# Patient Record
Sex: Male | Born: 1960
Health system: Southern US, Community
[De-identification: ages and names within clinical notes are randomized; demographics above are authoritative.]

## PROBLEM LIST (undated history)

## (undated) DIAGNOSIS — T7840XA Allergy, unspecified, initial encounter: Secondary | ICD-10-CM

## (undated) DIAGNOSIS — J439 Emphysema, unspecified: Secondary | ICD-10-CM

## (undated) DIAGNOSIS — K219 Gastro-esophageal reflux disease without esophagitis: Secondary | ICD-10-CM

## (undated) DIAGNOSIS — G250 Essential tremor: Secondary | ICD-10-CM

## (undated) HISTORY — PX: NO PAST SURGERIES: SHX2092

## (undated) HISTORY — DX: Gastro-esophageal reflux disease without esophagitis: K21.9

## (undated) HISTORY — DX: Emphysema, unspecified: J43.9

## (undated) HISTORY — DX: Essential tremor: G25.0

## (undated) HISTORY — DX: Allergy, unspecified, initial encounter: T78.40XA

---

## 2012-06-09 ENCOUNTER — Encounter: Payer: Self-pay | Admitting: Nurse Practitioner

## 2012-06-09 ENCOUNTER — Ambulatory Visit (INDEPENDENT_AMBULATORY_CARE_PROVIDER_SITE_OTHER): Payer: BC Managed Care – PPO | Admitting: Nurse Practitioner

## 2012-06-09 VITALS — BP 129/86 | HR 67 | Ht 73.0 in | Wt 224.0 lb

## 2012-06-09 DIAGNOSIS — G250 Essential tremor: Secondary | ICD-10-CM

## 2012-06-09 DIAGNOSIS — G252 Other specified forms of tremor: Secondary | ICD-10-CM

## 2012-06-09 MED ORDER — PROPRANOLOL HCL 10 MG PO TABS: 10.0000 mg | ORAL_TABLET | Freq: Two times a day (BID) | ORAL | Status: DC

## 2012-06-09 NOTE — Patient Instructions (Addendum)
Hand writing sample stable Continue propanolol 10 mg twice daily Prescription will be renewed Followup in 6-8 months

## 2012-06-09 NOTE — Progress Notes (Signed)
I have read the note, and I agree with the clinical assessment and plan.  

## 2012-06-09 NOTE — Progress Notes (Signed)
HPI: Patient returns for followup after her last visit 12/10/2011. History of benign essential tremor. The tremor affects only the arms without involvement of the head neck or voice. The patient has used Xanax in the past but he rarely uses this medication now.  When last seen his propanolol was increased to twice a day and his tremor is stable. No interval medical issues.  ROS:  - tremor  Physical Exam General: well developed, well nourished, seated, in no evident distress Head: head normocephalic and atraumatic. Oropharynx benign Neck: supple with no carotid or supraclavicular bruits Cardiovascular: regular rate and rhythm, no murmurs  Neurologic Exam Mental Status: Awake and fully alert.   Cranial Nerves:  Pupils equal, briskly reactive to light. Extraocular movements full without nystagmus. Visual fields full to confrontation. Hearing intact and symmetric to finger snap. Facial sensation intact. Face, tongue, palate move normally and symmetrically. Neck flexion and extension normal.  Motor: Normal bulk and tone. Normal strength in all tested extremity muscles. Minimal outstretched tremor. Sensory.: intact to touch and pinprick and vibratory.  Coordination: Rapid alternating movements normal in all extremities. Finger-to-nose and heel-to-shin performed accurately bilaterally. Gait and Station: Arises from chair without difficulty. Stance is normal. Gait demonstrates normal stride length and balance . Able to heel, toe and tandem walk without difficulty.  Reflexes: 2+ and symmetric. Toes downgoing.     ASSESSMENT: Benign essential tremor, writing sample obtained     PLAN: Continue propanolol 10 mg twice daily Followup in 6-8 months  [name] Riggs, GNP-BC APRN

## 2012-12-09 ENCOUNTER — Ambulatory Visit (INDEPENDENT_AMBULATORY_CARE_PROVIDER_SITE_OTHER): Payer: BC Managed Care – PPO | Admitting: Nurse Practitioner

## 2012-12-09 ENCOUNTER — Encounter: Payer: Self-pay | Admitting: Nurse Practitioner

## 2012-12-09 VITALS — BP 136/67 | HR 95 | Ht 73.0 in | Wt 227.0 lb

## 2012-12-09 DIAGNOSIS — G250 Essential tremor: Secondary | ICD-10-CM

## 2012-12-09 MED ORDER — PROPRANOLOL HCL 10 MG PO TABS: 10.0000 mg | ORAL_TABLET | Freq: Two times a day (BID) | ORAL | Status: DC

## 2012-12-09 NOTE — Progress Notes (Signed)
[facility]  PATIENT: Roger Copeland DOB: [birth year]   REASON FOR VISIT: Followup for benign essential tremor  HISTORY OF PRESENT ILLNESS:Roger Copeland, 52 year old male with history of benign essential tremor returns for followup. He was last seen in this office on 06/09/2012. The tremor affects only the arms without involvement of the head neck or voice. The patient has used Xanax in the past but he rarely uses this medication now. He is currently on  propanolol 10mg   twice a day and his tremor is stable. No interval medical issues. No difficulty performing his ADLs. No difficulty with his job performance.    REVIEW OF SYSTEMS: Full 14 system review of systems performed and notable only for:  Constitutional: N/A  Cardiovascular: N/A  Ear/Nose/Throat: N/A  Skin: N/A  Eyes: N/A  Respiratory: N/A  Gastroitestinal: N/A  Hematology/Lymphatic: N/A  Endocrine: N/A Musculoskeletal:N/A  Allergy/Immunology: N/A  Neurological: tremor Psychiatric: N/A   ALLERGIES: No Known Allergies  HOME MEDICATIONS: Outpatient Prescriptions Prior to Visit  Medication Sig Dispense Refill  . omeprazole (PRILOSEC) 20 MG capsule Take 20 mg by mouth daily.      . propranolol (INDERAL) 10 MG tablet Take 1 tablet (10 mg total) by mouth 2 (two) times daily.  60 tablet  8   No facility-administered medications prior to visit.    PAST MEDICAL HISTORY: Past Medical History  Diagnosis Date  . Essential tremor   . Gastroesophageal reflux disease     PAST SURGICAL HISTORY: History reviewed. No pertinent past surgical history.  FAMILY HISTORY: Family History  Problem Relation Age of Onset  . Heart failure Mother   . Lung cancer Father     SOCIAL HISTORY: History   Social History  . Marital Status: Married    Spouse Name: N/A    Number of Children: 1  . Years of Education: associates   Occupational History  . operations     [employer]   Social History Main Topics  .  Smoking status: Current Every Day Smoker -- 1.50 packs/day    Types: Cigarettes  . Smokeless tobacco: Never Used  . Alcohol Use: Yes     Comment: drinks on occasions  . Drug Use: No  . Sexual Activity: Not on file   Other Topics Concern  . Not on file   Social History Narrative   The patient is married and lives with his wife.      PHYSICAL EXAM  Filed Vitals:   12/09/12 0835  BP: 136/67  Pulse: 95  Height: 6\' 1"  (1.854 m)  Weight: 227 lb (102.967 kg)   Body mass index is 29.96 kg/(m^2).  Generalized: Well developed, in no acute distress   Neurological examination   Mentation: Alert oriented to time, place, history taking. Follows all commands speech and language fluent  Cranial nerve II-XII: Pupils were equal round reactive to light extraocular movements were full, visual field were full on confrontational test. Facial sensation and strength were normal. hearing was intact to finger rubbing bilaterally. Uvula tongue midline. head turning and shoulder shrug and were normal and symmetric.Tongue protrusion into cheek strength was normal. Motor: normal bulk and tone, full strength in the BUE, BLE, fine finger movements normal, no pronator drift. No focal weakness. Minimal outstretch tremor of the right index finger only. Coordination: finger-nose-finger, heel-to-shin bilaterally, no dysmetria Reflexes: Brachioradialis 2/2, biceps 2/2, triceps 2/2, patellar 2/2, Achilles 2/2, plantar responses were flexor bilaterally. Gait and Station: Rising up from seated position without assistance,  normal stance,  moderate stride, good arm swing, smooth turning, able to perform tiptoe, and heel walking without difficulty.   DIAGNOSTIC DATA (LABS, IMAGING, TESTING) - I reviewed patient records, labs, notes, testing and imaging myself where available.       ASSESSMENT AND PLAN  52 y.o. year old male  has a past medical history of Essential tremor here for followup. Tremor is in good  control Pt to continue propanolol 10 mg twice daily Renew for the next year Followup yearly and when necessary [name] Riggs, [facility], [facility], APRN  [facility] [address] [place], [place] [address] [phone]

## 2012-12-09 NOTE — Patient Instructions (Signed)
Pt to continue propanolol 10 mg twice daily Renew for the next year Followup yearly and when necessary

## 2012-12-09 NOTE — Progress Notes (Signed)
I have read the note, and I agree with the clinical assessment and plan.  Ramadan Couey KEITH   

## 2013-12-09 ENCOUNTER — Encounter: Payer: Self-pay | Admitting: Nurse Practitioner

## 2013-12-09 ENCOUNTER — Ambulatory Visit (INDEPENDENT_AMBULATORY_CARE_PROVIDER_SITE_OTHER): Payer: BC Managed Care – PPO | Admitting: Nurse Practitioner

## 2013-12-09 VITALS — BP 124/88 | HR 63 | Ht 73.0 in | Wt 219.0 lb

## 2013-12-09 DIAGNOSIS — G250 Essential tremor: Secondary | ICD-10-CM

## 2013-12-09 DIAGNOSIS — R251 Tremor, unspecified: Secondary | ICD-10-CM

## 2013-12-09 DIAGNOSIS — G252 Other specified forms of tremor: Principal | ICD-10-CM

## 2013-12-09 MED ORDER — PROPRANOLOL HCL 10 MG PO TABS: 20.0000 mg | ORAL_TABLET | Freq: Two times a day (BID) | ORAL | Status: DC

## 2013-12-09 NOTE — Progress Notes (Signed)
[facility]  PATIENT: Roger NettleJames Pitz DOB: 12/17/1960   REASON FOR VISIT:follow up for essential tremor   HISTORY OF PRESENT ILLNESS:Mr. Colarusso, 53 year old male with history of benign essential tremor returns for followup. He was last seen in this office on 12/09/12. The essential tremor affects only the arms without involvement of the head neck or voice. The patient has used Xanax in the past but he rarely uses this medication now. He is currently on propanolol 10mg  twice a day and his tremor is a little worse. 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 No interval medical issues. No difficulty performing his ADLs.  He returns for reevaluation  REVIEW OF SYSTEMS: Full 14 system review of systems performed and notable only for those listed, all others are neg:  Constitutional: N/A  Cardiovascular: N/A  Ear/Nose/Throat: N/A  Skin: N/A  Eyes: N/A  Respiratory: N/A  Gastroitestinal: N/A  Hematology/Lymphatic: N/A  Endocrine: N/A Musculoskeletal:N/A  Allergy/Immunology: N/A  Neurological: Essential tremor  Psychiatric: N/A Sleep : NA   ALLERGIES: No Known Allergies  HOME MEDICATIONS: Outpatient Prescriptions Prior to Visit  Medication Sig Dispense Refill  . CRESTOR 10 MG tablet       . omeprazole (PRILOSEC) 20 MG capsule Take 20 mg by mouth daily.      . propranolol (INDERAL) 10 MG tablet Take 1 tablet (10 mg total) by mouth 2 (two) times daily.  60 tablet  11   No facility-administered medications prior to visit.    PAST MEDICAL HISTORY: Past Medical History  Diagnosis Date  . Essential tremor   . Gastroesophageal reflux disease     PAST SURGICAL HISTORY: History reviewed. No pertinent past surgical history.  FAMILY HISTORY: Family History  Problem Relation Age of Onset  . Heart failure Mother   . Lung cancer Father     SOCIAL HISTORY: History   Social History  . Marital Status: Married    Spouse Name: Dedorah     Number of  Children: 1  . Years of Education: associates   Occupational History  . operations     [employer]  .     Social History Main Topics  . Smoking status: Current Every Day Smoker -- 1.50 packs/day    Types: Cigarettes  . Smokeless tobacco: Never Used  . Alcohol Use: Yes     Comment: drinks on occasions  . Drug Use: No  . Sexual Activity: Not on file   Other Topics Concern  . Not on file   Social History Narrative   The patient is married and lives with his wife.    Patient has 1 child.    Patient has a college education.    Patient is right handed.      PHYSICAL EXAM  Filed Vitals:   12/09/13 0833  BP: 124/88  Pulse: 63  Height: 6\' 1"  (1.854 m)  Weight: 219 lb (99.338 kg)   Body mass index is 28.9 kg/(m^2). Generalized: Well developed, in no acute distress  Neurological examination  Mentation: Alert oriented to time, place, history taking. Follows all commands speech and language fluent  Cranial nerve II-XII: Pupils were equal round reactive to light extraocular movements were full, visual field were full on confrontational test. Facial sensation and strength were normal. hearing was intact to finger rubbing bilaterally. Uvula tongue midline. head turning and shoulder shrug and were normal and symmetric.Tongue protrusion into cheek strength was normal.  Motor: normal bulk and tone, full strength  in the BUE, BLE, fine finger movements normal, no pronator drift. No focal weakness. Minimal outstretch tremor of both hands .  Coordination: finger-nose-finger, heel-to-shin bilaterally, no dysmetria  Reflexes: Brachioradialis 2/2, biceps 2/2, triceps 2/2, patellar 2/2, Achilles 2/2, plantar responses were flexor bilaterally.  Gait and Station: Rising up from seated position without assistance, normal stance, moderate stride, good arm swing, smooth turning, able to perform tiptoe, and heel walking without difficulty. Tandem gait is stable  DIAGNOSTIC DATA (LABS, IMAGING,  TESTING) -  ASSESSMENT AND PLAN  53 y.o. year old male  has a past medical history of Essential tremor here to followup. His tremor is confined to the hands and is slightly worse than when last seen. Occasionally interferes with his job performance.  Increase Inderal to 2 tabs twice daily Will refill Followup yearly and when necessary [name] RiggsNancy Carolyn Aydenn Gervin, [facility]NP, [facility], APRN  [facility] [address], [address] [place], [place] [address] [phone]

## 2013-12-09 NOTE — Progress Notes (Signed)
I have read the note, and I agree with the clinical assessment and plan.  Tristram Milian KEITH   

## 2013-12-09 NOTE — Patient Instructions (Signed)
Increase Inderal to 2 tabs twice daily Will refill Followup yearly and when necessary

## 2014-12-12 ENCOUNTER — Encounter: Payer: Self-pay | Admitting: Nurse Practitioner

## 2014-12-12 ENCOUNTER — Ambulatory Visit (INDEPENDENT_AMBULATORY_CARE_PROVIDER_SITE_OTHER): Payer: BLUE CROSS/BLUE SHIELD | Admitting: Nurse Practitioner

## 2014-12-12 VITALS — BP 138/98 | HR 70 | Ht 72.0 in | Wt 231.0 lb

## 2014-12-12 DIAGNOSIS — G250 Essential tremor: Secondary | ICD-10-CM

## 2014-12-12 MED ORDER — PROPRANOLOL HCL 10 MG PO TABS: 20.0000 mg | ORAL_TABLET | Freq: Two times a day (BID) | ORAL | Status: DC

## 2014-12-12 NOTE — Progress Notes (Signed)
I have read the note, and I agree with the clinical assessment and plan.  Roger Copeland,Roger Copeland   

## 2014-12-12 NOTE — Progress Notes (Signed)
[facility]  PATIENT: Patricia NettleJames Pastrana DOB: 02/18/1961   REASON FOR VISIT: Follow-up for essential tremor HISTORY FROM: Patient    HISTORY OF PRESENT ILLNESS:Mr. Schimpf, 54 year old male with history of benign essential tremor returns for followup. He was last seen in this office on 12/09/13. The essential tremor affects only the arms without involvement of the head, neck or voice. The patient has used Xanax in the past but he rarely uses this medication now. He is currently on propanolol 20mg  twice a day and his tremor is better. Dose was increased at last visit. 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 No interval medical issues. No difficulty performing his ADLs. He returns for reevaluation   REVIEW OF SYSTEMS: Full 14 system review of systems performed and notable only for those listed, all others are neg:  Constitutional: neg  Cardiovascular: neg Ear/Nose/Throat: neg  Skin: neg Eyes: neg Respiratory: neg Gastroitestinal: neg  Hematology/Lymphatic: neg  Endocrine: neg Musculoskeletal:neg Allergy/Immunology: neg Neurological: Essential tremor Psychiatric: neg Sleep : neg   ALLERGIES: No Known Allergies  HOME MEDICATIONS: Outpatient Prescriptions Prior to Visit  Medication Sig Dispense Refill  . CRESTOR 10 MG tablet     . omeprazole (PRILOSEC) 20 MG capsule Take 20 mg by mouth daily.    . propranolol (INDERAL) 10 MG tablet Take 2 tablets (20 mg total) by mouth 2 (two) times daily. 120 tablet 11   No facility-administered medications prior to visit.    PAST MEDICAL HISTORY: Past Medical History  Diagnosis Date  . Essential tremor   . Gastroesophageal reflux disease     PAST SURGICAL HISTORY: History reviewed. No pertinent past surgical history.  FAMILY HISTORY: Family History  Problem Relation Age of Onset  . Heart failure Mother   . Lung cancer Father     SOCIAL HISTORY: Social History   Social History  . Marital  Status: Married    Spouse Name: Clayborn BignessDedorah   . Number of Children: 1  . Years of Education: associates   Occupational History  . operations     [employer]  .     Social History Main Topics  . Smoking status: Current Every Day Smoker -- 1.50 packs/day    Types: Cigarettes  . Smokeless tobacco: Never Used  . Alcohol Use: Yes     Comment: drinks on occasions  . Drug Use: No  . Sexual Activity: Not on file   Other Topics Concern  . Not on file   Social History Narrative   The patient is married and lives with his wife.    Patient has 1 child.    Patient has a college education.    Patient is right handed.      PHYSICAL EXAM  Filed Vitals:   12/12/14 0841  BP: 138/98  Pulse: 70  Height: 6' (1.829 m)  Weight: 231 lb (104.781 kg)   Body mass index is 31.32 kg/(m^2). Generalized: Well developed, in no acute distress  Neurological examination  Mentation: Alert oriented to time, place, history taking. Follows all commands speech and language fluent  Cranial nerve II-XII: Pupils were equal round reactive to light extraocular movements were full, visual field were full on confrontational test. Facial sensation and strength were normal. hearing was intact to finger rubbing bilaterally. Uvula tongue midline. head turning and shoulder shrug and were normal and symmetric.Tongue protrusion into cheek strength was normal.  Motor: normal bulk and tone, full strength in the BUE, BLE, fine  finger movements normal, no pronator drift. No focal weakness. Minimal outstretch tremor of both hands .  Coordination: finger-nose-finger, heel-to-shin bilaterally, no dysmetria  Reflexes: Brachioradialis 2/2, biceps 2/2, triceps 2/2, patellar 2/2, Achilles 2/2, plantar responses were flexor bilaterally.  Gait and Station: Rising up from seated position without assistance, normal stance, moderate stride, good arm swing, smooth turning, able to perform tiptoe, and heel walking without difficulty.  Tandem gait is stable  DIAGNOSTIC DATA (LABS, IMAGING, TESTING) - ASSESSMENT AND PLAN  54 y.o. year old male  has a past medical history of Essential tremor here to follow-up.  Continue Inderal 20 mg twice daily for essential tremor will refill 3 months with  3 refills Follow-up yearly and when necessary [name] Riggs, [facility], [facility], APRN  [facility] [address] [place], [place] [phone]

## 2014-12-12 NOTE — Patient Instructions (Addendum)
Continue Inderal 20 mg twice daily for essential tremor will refill 3 months with  3 refills Follow-up yearly and when necessary

## 2015-01-20 ENCOUNTER — Other Ambulatory Visit: Payer: Self-pay | Admitting: Nurse Practitioner

## 2015-12-12 ENCOUNTER — Ambulatory Visit (INDEPENDENT_AMBULATORY_CARE_PROVIDER_SITE_OTHER): Payer: BLUE CROSS/BLUE SHIELD | Admitting: Nurse Practitioner

## 2015-12-12 ENCOUNTER — Encounter: Payer: Self-pay | Admitting: Nurse Practitioner

## 2015-12-12 VITALS — BP 126/91 | HR 65 | Ht 72.0 in | Wt 226.0 lb

## 2015-12-12 DIAGNOSIS — G250 Essential tremor: Secondary | ICD-10-CM

## 2015-12-12 MED ORDER — PROPRANOLOL HCL 10 MG PO TABS: 20.0000 mg | ORAL_TABLET | Freq: Two times a day (BID) | ORAL | 3 refills | Status: DC

## 2015-12-12 NOTE — Progress Notes (Signed)
[facility]  PATIENT: Roger Copeland DOB: 09-23-1960   REASON FOR VISIT: Follow-up for essential tremor HISTORY FROM: Patient    HISTORY OF PRESENT ILLNESS:Mr. Taylor, 55 year old male with history of benign essential tremor returns for yearly followup. The essential tremor affects only the arms without involvement of the head, neck or voice. The patient has used Xanax in the past but he rarely uses this medication now. He is currently on propanolol 20mg  twice a day and his tremor is better. He claims he can really tell the difference if he misses a dose . 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 No interval medical issues. No difficulty performing his ADLs. He returns for reevaluation   REVIEW OF SYSTEMS: Full 14 system review of systems performed and notable only for those listed, all others are neg:  Constitutional: neg  Cardiovascular: neg Ear/Nose/Throat: neg  Skin: neg Eyes: neg Respiratory: neg Gastroitestinal: neg  Hematology/Lymphatic: neg  Endocrine: neg Musculoskeletal:neg Allergy/Immunology: neg Neurological: Essential tremor Psychiatric: neg Sleep : neg   ALLERGIES: No Known Allergies  HOME MEDICATIONS: Outpatient Medications Prior to Visit  Medication Sig Dispense Refill  . CRESTOR 10 MG tablet     . omeprazole (PRILOSEC) 20 MG capsule Take 20 mg by mouth daily.    . propranolol (INDERAL) 10 MG tablet Take 2 tablets (20 mg total) by mouth 2 (two) times daily. 360 tablet 3  . propranolol (INDERAL) 10 MG tablet TAKE 2 TABLET BY MOUTH TWICE DAILY 120 tablet 11   No facility-administered medications prior to visit.     PAST MEDICAL HISTORY: Past Medical History:  Diagnosis Date  . Essential tremor   . Gastroesophageal reflux disease     PAST SURGICAL HISTORY: History reviewed. No pertinent surgical history.  FAMILY HISTORY: Family History  Problem Relation Age of Onset  . Heart failure Mother   . Lung cancer  Father     SOCIAL HISTORY: Social History   Social History  . Marital status: Married    Spouse name: Dedorah   . Number of children: 1  . Years of education: associates   Occupational History  . operations     [employer]  .  [employer]   Social History Main Topics  . Smoking status: Light Tobacco Smoker    Packs/day: 1.50    Types: Cigarettes  . Smokeless tobacco: Never Used  . Alcohol use Yes     Comment: drinks on occasions  . Drug use: No  . Sexual activity: Not on file   Other Topics Concern  . Not on file   Social History Narrative   The patient is married and lives with his wife.    Patient has 1 child.    Patient has a college education.    Patient is right handed.      PHYSICAL EXAM  Vitals:   12/12/15 0806  BP: (!) 126/91  Pulse: 65  Weight: 226 lb (102.5 kg)  Height: 6' (1.829 m)   Body mass index is 30.65 kg/m. Generalized: Well developed, in no acute distress  Neurological examination  Mentation: Alert oriented to time, place, history taking. Follows all commands speech and language fluent  Cranial nerve II-XII: Pupils were equal round reactive to light extraocular movements were full, visual field were full on confrontational test. Facial sensation and strength were normal. hearing was intact to finger rubbing bilaterally. Uvula tongue midline. head turning and shoulder shrug and were normal and symmetric.Tongue protrusion  into cheek strength was normal.  Motor: normal bulk and tone, full strength in the BUE, BLE, fine finger movements normal, no pronator drift. No focal weakness. Minimal outstretch tremor of both hands R> L .  Coordination: finger-nose-finger, heel-to-shin bilaterally, no dysmetria  Reflexes: Brachioradialis 2/2, biceps 2/2, triceps 2/2, patellar 2/2, Achilles 2/2, plantar responses were flexor bilaterally.  Gait and Station: Rising up from seated position without assistance, normal stance, moderate stride, good arm swing, smooth  turning, able to perform tiptoe, and heel walking without difficulty. Tandem gait is stable  DIAGNOSTIC DATA (LABS, IMAGING, TESTING) - ASSESSMENT AND PLAN  55 y.o. year old male  has a past medical history of Essential tremor here to follow-up.  Continue Inderal 20 mg twice daily for essential tremor will refill 3 months with  3 refills Follow-up yearly and when necessary [name] RiggsNancy Carolyn Versa Craton, [facility]NP, [facility]BC, APRN  [facility] [address], [address] [place], [place] [address] [phone]

## 2015-12-12 NOTE — Patient Instructions (Addendum)
Continue Inderal 20 mg twice daily for essential tremor will refill 3 months with  3 refills Follow-up yearly and when necessary 

## 2015-12-12 NOTE — Progress Notes (Signed)
I have read the note, and I agree with the clinical assessment and plan.  WILLIS,CHARLES KEITH   

## 2016-03-20 ENCOUNTER — Other Ambulatory Visit: Payer: Self-pay | Admitting: Nurse Practitioner

## 2016-12-16 NOTE — Progress Notes (Signed)
[facility]  PATIENT: Roger Copeland DOB: [birth year]   REASON FOR VISIT: Follow-up for essential tremor HISTORY FROM: Patient    HISTORY OF PRESENT ILLNESS:Mr. Roger Copeland, 56 year old male with history of benign essential tremor returns for yearly followup. The essential tremor affects only the arms without involvement of the head, neck or voice. The patient has used Xanax in the past but he rarely uses this medication now. He is currently on propanolol 20mg  twice a day and his tremor is stable. He claims he can really tell the difference if he misses a dose . 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 No interval medical issues. No difficulty performing his ADLs.He occasionally has difficulty with his writing. He was advised to get an oversized pen. He just had recent labs at primary care. Labs were stable . He has been told he is prediabetic. He occasionally  snores He returns for reevaluation   REVIEW OF SYSTEMS: Full 14 system review of systems performed and notable only for those listed, all others are neg:  Constitutional: neg  Cardiovascular: neg Ear/Nose/Throat: neg  Skin: neg Eyes: neg Respiratory: neg Gastroitestinal: neg  Hematology/Lymphatic: neg  Endocrine: neg Musculoskeletal:neg Allergy/Immunology: neg Neurological: Essential tremor Psychiatric: neg Sleep : Snores, denies daytime drowsiness   ALLERGIES: No Known Allergies  HOME MEDICATIONS: Outpatient Medications Prior to Visit  Medication Sig Dispense Refill  . CRESTOR 10 MG tablet     . omeprazole (PRILOSEC) 20 MG capsule Take 20 mg by mouth daily.    . propranolol (INDERAL) 10 MG tablet Take 2 tablets (20 mg total) by mouth 2 (two) times daily. 360 tablet 3   No facility-administered medications prior to visit.     PAST MEDICAL HISTORY: Past Medical History:  Diagnosis Date  . Essential tremor   . Gastroesophageal reflux disease     PAST SURGICAL HISTORY: History  reviewed. No pertinent surgical history.  FAMILY HISTORY: Family History  Problem Relation Age of Onset  . Heart failure Mother   . Lung cancer Father     SOCIAL HISTORY: Social History   Social History  . Marital status: Married    Spouse name: Dedorah   . Number of children: 1  . Years of education: associates   Occupational History  . operations     [employer]  .  [employer]   Social History Main Topics  . Smoking status: Light Tobacco Smoker    Packs/day: 1.50    Types: Cigarettes  . Smokeless tobacco: Never Used  . Alcohol use Yes     Comment: drinks on occasions  . Drug use: No  . Sexual activity: Not on file   Other Topics Concern  . Not on file   Social History Narrative   The patient is married and lives with his wife.    Patient has 1 child.    Patient has a college education.    Patient is right handed.      PHYSICAL EXAM  Vitals:   12/17/16 0719  BP: (!) 136/96  Pulse: 69  Weight: 230 lb (104.3 kg)  Height: 5\' 9"  (1.753 m)   Body mass index is 33.97 kg/m. Generalized: Well developed, in no acute distress  Neurological examination  Mentation: Alert oriented to time, place, history taking. Follows all commands speech and language fluent  Cranial nerve II-XII: Pupils were equal round reactive to light extraocular movements were full, visual field were full on confrontational test. Facial sensation and  strength were normal. hearing was intact to finger rubbing bilaterally. Uvula tongue midline. head turning and shoulder shrug and were normal and symmetric.Tongue protrusion into cheek strength was normal.  Motor: normal bulk and tone, full strength in the BUE, BLE, fine finger movements normal, no pronator drift. No focal weakness. Minimal outstretch tremor of both hands R> L .  Coordination: finger-nose-finger, heel-to-shin bilaterally, no dysmetria  Reflexes: Brachioradialis 2/2, biceps 2/2, triceps 2/2, patellar 2/2, Achilles 2/2, plantar  responses were flexor bilaterally.  Gait and Station: Rising up from seated position without assistance, normal stance, moderate stride, good arm swing, smooth turning, able to perform tiptoe, and heel walking without difficulty. Tandem gait is stable  DIAGNOSTIC DATA (LABS, IMAGING, TESTING) - ASSESSMENT AND PLAN  56 y.o. year old male  has a past medical history of Essential tremor here to follow-up.  Continue Inderal 20 mg twice daily for essential tremor will refill 3 months with  3 refills Follow-up yearly and when necessary For snoring may need to have sleep study. Ask wife if you  stop breathing at night. This would be an indication that he did have a sleep study. I spent 20 min  in total face to face time with the patient more than 50% of which was spent counseling and coordination of care, reviewing test results reviewing medications and discussing and reviewing the diagnosis of essential tremor and what a sleep study entails. Patient will call back to set up. [name] RiggsNancy Carolyn Khyrin Trevathan, [facility]villeGNP, [facility]denBC, APRN  [facility] [address], [address] [place], [place] [address] [phone]

## 2016-12-17 ENCOUNTER — Encounter: Payer: Self-pay | Admitting: Nurse Practitioner

## 2016-12-17 ENCOUNTER — Ambulatory Visit (INDEPENDENT_AMBULATORY_CARE_PROVIDER_SITE_OTHER): Payer: BLUE CROSS/BLUE SHIELD | Admitting: Nurse Practitioner

## 2016-12-17 VITALS — BP 136/96 | HR 69 | Ht 69.0 in | Wt 230.0 lb

## 2016-12-17 DIAGNOSIS — G250 Essential tremor: Secondary | ICD-10-CM | POA: Diagnosis not present

## 2016-12-17 DIAGNOSIS — R0683 Snoring: Secondary | ICD-10-CM | POA: Diagnosis not present

## 2016-12-17 MED ORDER — PROPRANOLOL HCL 10 MG PO TABS: 20.0000 mg | ORAL_TABLET | Freq: Two times a day (BID) | ORAL | 3 refills | Status: DC

## 2016-12-17 NOTE — Patient Instructions (Signed)
Continue Inderal 20 mg twice daily for essential tremor will refill 3 months with  3 refills Follow-up yearly and when necessary 

## 2016-12-17 NOTE — Progress Notes (Signed)
I have read the note, and I agree with the clinical assessment and plan.  Roger Copeland,Roger Copeland   

## 2017-01-04 ENCOUNTER — Other Ambulatory Visit: Payer: Self-pay | Admitting: Nurse Practitioner

## 2017-01-18 ENCOUNTER — Other Ambulatory Visit: Payer: Self-pay | Admitting: Nurse Practitioner

## 2017-04-29 ENCOUNTER — Telehealth: Payer: Self-pay | Admitting: General Practice

## 2017-04-29 NOTE — Telephone Encounter (Signed)
Called pt regarding his request to establish care with Dr. Carmelia RollerWendling. LVM avising him to call back to schedule a new patient appt.

## 2017-05-07 ENCOUNTER — Ambulatory Visit: Payer: BLUE CROSS/BLUE SHIELD | Admitting: Family Medicine

## 2017-05-07 ENCOUNTER — Encounter: Payer: Self-pay | Admitting: Family Medicine

## 2017-05-07 VITALS — BP 118/80 | HR 64 | Temp 97.8°F | Ht 72.0 in | Wt 221.4 lb

## 2017-05-07 DIAGNOSIS — Z23 Encounter for immunization: Secondary | ICD-10-CM

## 2017-05-07 DIAGNOSIS — E7800 Pure hypercholesterolemia, unspecified: Secondary | ICD-10-CM | POA: Diagnosis not present

## 2017-05-07 DIAGNOSIS — E559 Vitamin D deficiency, unspecified: Secondary | ICD-10-CM

## 2017-05-07 DIAGNOSIS — K429 Umbilical hernia without obstruction or gangrene: Secondary | ICD-10-CM | POA: Diagnosis not present

## 2017-05-07 DIAGNOSIS — Z720 Tobacco use: Secondary | ICD-10-CM | POA: Diagnosis not present

## 2017-05-07 DIAGNOSIS — G250 Essential tremor: Secondary | ICD-10-CM | POA: Diagnosis not present

## 2017-05-07 DIAGNOSIS — K439 Ventral hernia without obstruction or gangrene: Secondary | ICD-10-CM | POA: Insufficient documentation

## 2017-05-07 NOTE — Progress Notes (Addendum)
Chief Complaint  Patient presents with  . Establish Care       New Patient Visit SUBJECTIVE: HPI: Roger Copeland is an 57 y.o.male who is being seen for establishing care.  The patient was previously seen at [facility].  Hyperlipidemia Patient presents for dyslipidemia follow up. Currently being treated with Crestor 10 mg daily and compliance with treatment thus far has been good. He denies myalgias. He is sometimes adhering to a healthy. The patient exercises never.  The patient is not known to have coexisting coronary artery disease.  Tremor He has a benign essential tremor that is treated with propanolol 40 mg twice daily. He does still have some shaking, however it is not enough to change his medication dosage. He is tolerating the medicine well and reports compliance.  He has a history of an umbilical hernia.  It is not significantly changing and does not cause any discomfort.  His wife wants him to to have it taken care of.  He does not wish for referral at this time.  No Known Allergies  Past Medical History:  Diagnosis Date  . Allergy   . Essential tremor   . Gastroesophageal reflux disease    History reviewed. No pertinent surgical history.   Social History   Socioeconomic History  . Marital status: Married    Spouse name: Dedorah   . Number of children: 1  . Years of education: associates  Tobacco Use  . Smoking status: Current Every Day Smoker    Packs/day: 0.75    Years: 40.00    Pack years: 30.00    Types: Cigarettes  . Smokeless tobacco: Never Used  Substance and Sexual Activity  . Alcohol use: Yes    Comment: drinks on occasions  . Drug use: No  Social History Narrative   The patient is married and lives with his wife.    Patient has 1 child.    Patient has a college education.    Patient is right handed.    Family History  Problem Relation Age of Onset  . Heart failure Mother   . Heart disease Mother   . Lung cancer  Father   . Cancer Father      Current Outpatient Medications:  .  cholecalciferol (VITAMIN D) 1000 units tablet, Take 2 tablets (2,000 Units total) by mouth daily., Disp: , Rfl:  .  CRESTOR 10 MG tablet, , Disp: , Rfl:  .  glycopyrrolate (ROBINUL) 2 MG tablet, Take 2 mg by mouth as needed., Disp: , Rfl:  .  Multiple Vitamin (MULTIVITAMIN) tablet, Take 1 tablet by mouth daily., Disp: , Rfl:  .  omeprazole (PRILOSEC) 20 MG capsule, Take 20 mg by mouth daily., Disp: , Rfl:  .  propranolol (INDERAL) 10 MG tablet, Take 2 tablets (20 mg total) by mouth 2 (two) times daily., Disp: 360 tablet, Rfl: 3  ROS Cardiovascular: Denies chest pain  Respiratory: Denies dyspnea   OBJECTIVE: BP 118/80 (BP Location: Right Arm, Patient Position: Sitting, Cuff Size: Large)   Pulse 64   Temp 97.8 F (36.6 C) (Oral)   Ht 6' (1.829 m)   Wt 221 lb 6 oz (100.4 kg)   SpO2 95%   BMI 30.02 kg/m   Constitutional: -  VS reviewed -  Well developed, well nourished, appears stated age -  No apparent distress  Psychiatric: -  Oriented to person, place, and time -  Memory intact -  Affect and mood normal -  Fluent conversation,  good eye contact -  Judgment and insight age appropriate  Eye: -  Conjunctivae clear, no discharge -  Pupils symmetric, round, reactive to light  ENMT: -  MMM    Pharynx moist, no exudate, no erythema  Neck: -  No gross swelling, no palpable masses -  Thyroid midline, not enlarged, mobile, no palpable masses  Cardiovascular: -  RRR -  No LE edema  Respiratory: -  Normal respiratory effort, no accessory muscle use, no retraction -  Breath sounds equal, no wheezes, no ronchi, no crackles  Gastrointestinal: -  Reproducible mass over umbilical region  Neurological:  -  CN II - XII grossly intact -  Resting tremor noted  Skin: -  No significant lesion on inspection -  Warm and dry to palpation   ASSESSMENT/PLAN: Pure hypercholesterolemia  Vitamin D insufficiency - Plan: Vitamin D  (25 hydroxy)  Essential tremor  Umbilical hernia without obstruction and without gangrene  Need for vaccination against Streptococcus pneumoniae - Plan: Pneumococcal polysaccharide vaccine 23-valent greater than or equal to 2yo subcutaneous/IM  Patient instructed to sign release of records form from his previous PCP. OK to stop CoQ10 to see if muscle aches come about. Increase dose of Vit D from 1000 u/d to 2000 u daily. Counseled on tobacco cessation.  PCV23 given today.  Offered CT low-dose of the chest for lung cancer screening, he would like to think about this.  I gave him some information.  He will let us know if he changes his mind. Patient should return in 6 mo for CPE, 12 weeks for Vit D recheck. The patient voiced understanding and agreement to the plan.   Jilda Rocheicholas Paul FlorenceWendling, DO 05/07/17  12:16 PM

## 2017-05-07 NOTE — Patient Instructions (Addendum)
Lung Cancer Screening  A lung cancer screening is a test that checks for lung cancer. Lung cancer screening is done to look for lung cancer in its very early stages, before it spreads and becomes harder to treat and before symptoms appear. Finding cancer early improves the chances of successful treatment. It may save your life.  Should I be screened for lung cancer?  You should be screened for lung cancer if all of these apply:  · You currently smoke or you have quit smoking within the past 15 years.  · You are 55-74 years old. Screening may be recommended up to age 80 depending on your overall health and other factors.  · You are in good general health.  · You have a 30-pack-year smoking history.    To find your pack-year history, multiply how many packages of cigarettes you smoked each day by the number of years you smoked. For example, if you smoked two packs of cigarettes each day for 15 years, your pack-year history is 30. If you are not sure what your pack-year smoking history is, ask your health care provider.  Screening may also be recommended if you are at high risk for the disease. You may be at high risk if:  · You have a family history of lung cancer.  · You have been exposed to asbestos.  · You have chronic obstructive pulmonary disease (COPD).  · You have a history of previous lung cancer.    How often should I be screened for lung cancer?  If you are at risk for lung cancer, it is recommended that you are screened once a year. The recommended screening test is a low-dose CT scan.  How can I lower my risk of lung cancer?  To lower your risk of developing lung cancer:  · If you smoke, stop smoking all tobacco products.  · Avoid secondhand smoke.  · Avoid exposure to radiation.  · Avoid exposure to radon gas. Have your home checked for radon regularly.  · Avoid things that cause cancer (carcinogens).  · Avoid living or working in places with high air pollution.    Where to find more information:  Ask  your health care provider about the risks and benefits of screening. More information and resources are available from these organizations:  · American Cancer Society (ACS): www.cancer.org  · American Lung Association: www.lung.org    Contact a health care provider if:  · You start to show symptoms of lung cancer, including:  ? Coughing that will not go away.  ? Wheezing.  ? Chest pain.  ? Coughing up blood.  ? Shortness of breath.  ? Weight loss that cannot be explained.  ? Constant fatigue.  Summary  · Lung cancer screening may find lung cancer before symptoms appear. Finding cancer early improves the chances of successful treatment. It may save your life.  · If you are at risk for lung cancer, it is recommended that you are screened once a year. The recommended screening test is a low-dose CT scan.  · You can make lifestyle changes to lower your risk of lung cancer.  · Ask your health care provider about the risks and benefits of screening.  This information is not intended to replace advice given to you by your health care provider. Make sure you discuss any questions you have with your health care provider.  Document Released: 12/27/2015 Document Revised: 12/27/2015 Document Reviewed: 12/27/2015  Elsevier Interactive Patient Education © 2018 Elsevier Inc.

## 2017-05-07 NOTE — Progress Notes (Signed)
Pre visit review using our clinic review tool, if applicable. No additional management support is needed unless otherwise documented below in the visit note. 

## 2017-05-08 ENCOUNTER — Encounter: Payer: Self-pay | Admitting: Family Medicine

## 2017-07-30 ENCOUNTER — Other Ambulatory Visit (INDEPENDENT_AMBULATORY_CARE_PROVIDER_SITE_OTHER): Payer: No Typology Code available for payment source

## 2017-07-30 DIAGNOSIS — E559 Vitamin D deficiency, unspecified: Secondary | ICD-10-CM

## 2017-07-30 LAB — VITAMIN D 25 HYDROXY (VIT D DEFICIENCY, FRACTURES): VITD: 25.88 ng/mL — AB (ref 30.00–100.00)

## 2017-11-10 ENCOUNTER — Other Ambulatory Visit: Payer: Self-pay

## 2017-11-10 ENCOUNTER — Encounter: Payer: Self-pay | Admitting: Family Medicine

## 2017-11-10 ENCOUNTER — Ambulatory Visit (INDEPENDENT_AMBULATORY_CARE_PROVIDER_SITE_OTHER): Payer: PRIVATE HEALTH INSURANCE | Admitting: Family Medicine

## 2017-11-10 VITALS — BP 130/84 | HR 60 | Temp 97.6°F | Resp 16 | Ht 67.0 in | Wt 217.0 lb

## 2017-11-10 DIAGNOSIS — Z114 Encounter for screening for human immunodeficiency virus [HIV]: Secondary | ICD-10-CM | POA: Diagnosis not present

## 2017-11-10 DIAGNOSIS — Z1159 Encounter for screening for other viral diseases: Secondary | ICD-10-CM | POA: Diagnosis not present

## 2017-11-10 DIAGNOSIS — F17210 Nicotine dependence, cigarettes, uncomplicated: Secondary | ICD-10-CM

## 2017-11-10 DIAGNOSIS — Z23 Encounter for immunization: Secondary | ICD-10-CM

## 2017-11-10 DIAGNOSIS — Z0000 Encounter for general adult medical examination without abnormal findings: Secondary | ICD-10-CM

## 2017-11-10 LAB — LIPID PANEL
CHOL/HDL RATIO: 4
CHOLESTEROL: 119 mg/dL (ref 0–200)
HDL: 33.8 mg/dL — AB (ref >39.00)
LDL Cholesterol: 64 mg/dL (ref 0–99)
NonHDL: 85.29
TRIGLYCERIDES: 106 mg/dL (ref 0.0–149.0)
VLDL: 21.2 mg/dL (ref 0.0–40.0)

## 2017-11-10 LAB — COMPREHENSIVE METABOLIC PANEL
ALBUMIN: 4.3 g/dL (ref 3.5–5.2)
ALK PHOS: 51 U/L (ref 39–117)
ALT: 19 U/L (ref 0–53)
AST: 16 U/L (ref 0–37)
BILIRUBIN TOTAL: 0.7 mg/dL (ref 0.2–1.2)
BUN: 11 mg/dL (ref 6–23)
CALCIUM: 9.4 mg/dL (ref 8.4–10.5)
CO2: 31 mEq/L (ref 19–32)
CREATININE: 0.94 mg/dL (ref 0.40–1.50)
Chloride: 101 mEq/L (ref 96–112)
GFR: 87.31 mL/min (ref >60.00)
Glucose, Bld: 100 mg/dL — ABNORMAL HIGH (ref 70–99)
Potassium: 4.3 mEq/L (ref 3.5–5.1)
Sodium: 138 mEq/L (ref 135–145)
TOTAL PROTEIN: 6.9 g/dL (ref 6.0–8.3)

## 2017-11-10 MED ORDER — PROPRANOLOL HCL ER 60 MG PO CP24: 60.0000 mg | ORAL_CAPSULE | Freq: Every day | ORAL | 1 refills | Status: DC

## 2017-11-10 MED ORDER — ROSUVASTATIN CALCIUM 10 MG PO TABS: 10.0000 mg | ORAL_TABLET | Freq: Every day | ORAL | 3 refills | Status: DC

## 2017-11-10 NOTE — Patient Instructions (Addendum)
Give us 2-3 business days to get the results of your labs back.  Stop smoking. We will be in touch regarding your CT scan to screen for lung cancer.  Keep up the good work with your weight loss.  Stay active.  We are moving up on the dose of your propranolol for your tremor. OK to stop your current medicine and start the new once-daily medication.   Let us know if you need anything.

## 2017-11-10 NOTE — Progress Notes (Signed)
Chief Complaint  Patient presents with  . Annual Exam    Well Male Roger Copeland is here for a complete physical.   His last physical was >1 year ago.  Current diet: in general, an "OK" diet.  Current exercise: walking Weight trend: losing weight Seat belt? Yes.    Health maintenance Colonoscopy- Yes - 4 years ago; [facility] Tetanus- Yes - 4 years ago HIV- No Hep C- No Lung cancer screening- No Prostate cancer screening- No   Past Medical History:  Diagnosis Date  . Allergy   . Essential tremor   . Gastroesophageal reflux disease       Past Surgical History:  Procedure Laterality Date  . NO PAST SURGERIES      Medications  Current Outpatient Medications on File Prior to Visit  Medication Sig Dispense Refill  . cholecalciferol (VITAMIN D) 1000 units tablet Take 5,000 Units by mouth daily.     . CRESTOR 10 MG tablet     . glycopyrrolate (ROBINUL) 2 MG tablet Take 2 mg by mouth as needed.    . Multiple Vitamin (MULTIVITAMIN) tablet Take 1 tablet by mouth daily.    Marland Kitchen omeprazole (PRILOSEC) 20 MG capsule Take 20 mg by mouth daily.    . propranolol (INDERAL) 10 MG tablet Take 2 tablets (20 mg total) by mouth 2 (two) times daily. 360 tablet 3    Allergies Allergies  Allergen Reactions  . Bee Venom Hives    Family History Family History  Problem Relation Age of Onset  . Heart failure Mother   . Heart disease Mother   . Lung cancer Father   . Cancer Father     Review of Systems: Constitutional:  no fevers Eye:  no recent significant change in vision Ear/Nose/Mouth/Throat:  Ears:  no hearing loss Nose/Mouth/Throat:  no complaints of nasal congestion, no sore throat Cardiovascular:  no chest pain, no palpitations Respiratory:  no cough and no shortness of breath Gastrointestinal:  no abdominal pain, no change in bowel habits GU:  Male: negative for dysuria, frequency, and incontinence and negative for prostate symptoms Musculoskeletal/Extremities:  no  pain, redness, or swelling of the joints Integumentary (Skin/Breast):  no abnormal skin lesions reported Neurologic:  no headaches Endocrine: No unexpected weight changes Hematologic/Lymphatic:  no abnormal bleeding  Exam BP 130/84 (BP Location: Right Arm, Patient Position: Sitting, Cuff Size: Large)   Pulse 60   Temp 97.6 F (36.4 C)   Resp 16   Ht 5\' 7"  (1.702 m)   Wt 217 lb (98.4 kg)   SpO2 98%   BMI 33.99 kg/m  General:  well developed, well nourished, in no apparent distress Skin:  no significant moles, warts, or growths Head:  no masses, lesions, or tenderness Eyes:  pupils equal and round, sclera anicteric without injection Ears:  canals without lesions, TMs shiny without retraction, no obvious effusion, no erythema Nose:  nares patent, septum midline, mucosa normal Throat/Pharynx:  lips and gingiva without lesion; tongue and uvula midline; non-inflamed pharynx; no exudates or postnasal drainage Neck: neck supple without adenopathy, thyromegaly, or masses Lungs:  clear to auscultation, breath sounds equal bilaterally, no respiratory distress Cardio:  regular rate and rhythm, no LE edema, no bruits Abdomen:  abdomen soft, nontender; bowel sounds normal; no masses or organomegaly Genital (male): circumcised penis, no lesions or discharge; testes present bilaterally without masses or tenderness Rectal: Deferred Musculoskeletal:  symmetrical muscle groups noted without atrophy or deformity Extremities:  no clubbing, cyanosis, or edema, no deformities,  no skin discoloration Neuro: Intentional tremor noted b/l UE's; gait normal; deep tendon reflexes normal and symmetric Psych: well oriented with normal range of affect and appropriate judgment/insight  Assessment and Plan  Well adult exam - Plan: Comprehensive metabolic panel, Lipid panel, Flu Vaccine QUAD 6+ mos PF IM (Fluarix Quad PF)  Smoking greater than 30 pack years - Plan: CT CHEST LUNG CANCER SCREENING LOW DOSE WO  CONTRAST  Encounter for hepatitis C screening test for low risk patient - Plan: Hepatitis C antibody  Screening for HIV (human immunodeficiency virus) - Plan: HIV Antibody (routine testing w rflx)   Well 57 y.o. male. Counseled on diet and exercise. Counseled on risks and benefits of prostate cancer screening with PSA. The patient agrees to forego testing. Immunizations, labs, and further orders as above. Follow up in 6 mo. The patient voiced understanding and agreement to the plan.  Jilda Rocheicholas Paul Yuk[place]Wend[place], DO 11/10/17 8:16 AM

## 2017-11-11 LAB — HEPATITIS C ANTIBODY
HEP C AB: NONREACTIVE
SIGNAL TO CUT-OFF: 0.03 (ref <1.00)

## 2017-11-11 LAB — HIV ANTIBODY (ROUTINE TESTING W REFLEX): HIV 1&2 Ab, 4th Generation: NONREACTIVE

## 2017-11-18 ENCOUNTER — Ambulatory Visit (HOSPITAL_BASED_OUTPATIENT_CLINIC_OR_DEPARTMENT_OTHER)
Admission: RE | Admit: 2017-11-18 | Discharge: 2017-11-18 | Disposition: A | Discharge status: Home / Self Care | Payer: PRIVATE HEALTH INSURANCE | Source: Ambulatory Visit | Attending: Family Medicine | Admitting: Family Medicine

## 2017-11-18 DIAGNOSIS — R918 Other nonspecific abnormal finding of lung field: Secondary | ICD-10-CM | POA: Insufficient documentation

## 2017-11-18 DIAGNOSIS — F17210 Nicotine dependence, cigarettes, uncomplicated: Secondary | ICD-10-CM | POA: Diagnosis not present

## 2017-11-18 DIAGNOSIS — J438 Other emphysema: Secondary | ICD-10-CM | POA: Insufficient documentation

## 2017-11-18 DIAGNOSIS — J432 Centrilobular emphysema: Secondary | ICD-10-CM | POA: Diagnosis not present

## 2017-12-17 NOTE — Progress Notes (Signed)
[facility]  PATIENT: Roger Copeland DOB: 03-04-1960   REASON FOR VISIT: Follow-up for essential tremor HISTORY FROM: Patient    HISTORY OF PRESENT ILLNESS:Roger Copeland, 57 year old male with history of benign essential tremor returns for yearly followup. The essential tremor affects only the arms without involvement of the head, neck or voice. His PCP changed his dose to Propranolol LA 60mg  about 3 weeks ago. His  tremor is stable. He claims he can really tell the difference if he misses a dose . 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 No interval medical issues. No difficulty performing his ADLs.He occasionally has difficulty with his writing. He was advised to get an oversized pen.  He was also advised to get weighted utensils for eating.   He has been told he is prediabetic. He returns for reevaluation   REVIEW OF SYSTEMS: Full 14 system review of systems performed and notable only for those listed, all others are neg:  Constitutional: neg  Cardiovascular: neg Ear/Nose/Throat: neg  Skin: neg Eyes: neg Respiratory: neg Gastroitestinal: neg  Hematology/Lymphatic: neg  Endocrine: neg Musculoskeletal:neg Allergy/Immunology: neg Neurological: Essential tremor Psychiatric: neg Sleep : neg   ALLERGIES: Allergies  Allergen Reactions  . Bee Venom Hives    HOME MEDICATIONS: Outpatient Medications Prior to Visit  Medication Sig Dispense Refill  . Multiple Vitamin (MULTIVITAMIN) tablet Take 1 tablet by mouth daily.    Marland Kitchen omeprazole (PRILOSEC) 20 MG capsule Take 20 mg by mouth daily.    . propranolol ER (INDERAL LA) 60 MG 24 hr capsule Take 1 capsule (60 mg total) by mouth daily. 90 capsule 1  . rosuvastatin (CRESTOR) 10 MG tablet Take 1 tablet (10 mg total) by mouth daily. 90 tablet 3  . cholecalciferol (VITAMIN D) 1000 units tablet Take 5,000 Units by mouth daily.     Marland Kitchen glycopyrrolate (ROBINUL) 2 MG tablet Take 2 mg by mouth as needed.     No  facility-administered medications prior to visit.     PAST MEDICAL HISTORY: Past Medical History:  Diagnosis Date  . Allergy   . Essential tremor   . Gastroesophageal reflux disease     PAST SURGICAL HISTORY: Past Surgical History:  Procedure Laterality Date  . NO PAST SURGERIES      FAMILY HISTORY: Family History  Problem Relation Age of Onset  . Heart failure Mother   . Heart disease Mother   . Lung cancer Father   . Cancer Father     SOCIAL HISTORY: Social History   Socioeconomic History  . Marital status: Married    Spouse name: Roger Copeland   . Number of children: 1  . Years of education: associates  . Highest education level: Not on file  Occupational History  . Occupation: operations    Comment: local [employer]    Employer: [employer]  Social Needs  . Financial resource strain: Not on file  . Food insecurity:    Worry: Not on file    Inability: Not on file  . Transportation needs:    Medical: Not on file    Non-medical: Not on file  Tobacco Use  . Smoking status: Current Every Day Smoker    Packs/day: 0.75    Years: 40.00    Pack years: 30.00    Types: Cigarettes  . Smokeless tobacco: Never Used  Substance and Sexual Activity  . Alcohol use: Yes    Comment: drinks on occasions  . Drug use: No  . Sexual  activity: Not on file  Lifestyle  . Physical activity:    Days per week: Not on file    Minutes per session: Not on file  . Stress: Not on file  Relationships  . Social connections:    Talks on phone: Not on file    Gets together: Not on file    Attends religious service: Not on file    Active member of club or organization: Not on file    Attends meetings of clubs or organizations: Not on file    Relationship status: Not on file  . Intimate partner violence:    Fear of current or ex partner: Not on file    Emotionally abused: Not on file    Physically abused: Not on file    Forced sexual activity: Not on file  Other Topics Concern  . Not on file    Social History Narrative   The patient is married and lives with his wife.    Patient has 1 child.    Patient has a college education.    Patient is right handed.      PHYSICAL EXAM  Vitals:   12/18/17 0738  BP: 128/89  Pulse: 60  Weight: 220 lb 6.4 oz (100 kg)   Body mass index is 34.52 kg/m. Generalized: Well developed, in no acute distress  Neurological examination  Mentation: Alert oriented to time, place, history taking. Follows all commands speech and language fluent  Cranial nerve II-XII: Pupils were equal round reactive to light extraocular movements were full, visual field were full on confrontational test. Facial sensation and strength were normal. hearing was intact to finger rubbing bilaterally. Uvula tongue midline. head turning and shoulder shrug and were normal and symmetric.Tongue protrusion into cheek strength was normal.  Motor: normal bulk and tone, full strength in the BUE, BLE, fine finger movements normal, no pronator drift. No focal weakness. Minimal outstretch tremor of both hands R> L .  Coordination: finger-nose-finger, heel-to-shin bilaterally, no dysmetria  Reflexes: Brachioradialis 2/2, biceps 2/2, triceps 2/2, patellar 2/2, Achilles 2/2, plantar responses were flexor bilaterally.  Gait and Station: Rising up from seated position without assistance, normal stance, moderate stride, good arm swing, smooth turning, able to perform tiptoe, and heel walking without difficulty. Tandem gait is stable  DIAGNOSTIC DATA (LABS, IMAGING, TESTING) - ASSESSMENT AND PLAN  57 y.o. year old male  has a past medical history of Essential tremor here to follow-up.  Continue Inderal 60 ER daily this was oncreased by PCP Can use weighted utensils  Can for worsening tremor Follow-up yearly and when necessary I spent 20 min  in total face to face time with the patient more than 50% of which was spent counseling and coordination of care, reviewing test results  reviewing medications and discussing and reviewing the diagnosis of essential tremor.-Patient was also made aware that caffeine can increase the tremor, he was given patient information on essential tremor [name] Riggs, [facility], [facility], APRN  [facility] [address] [place], [place] [address] [phone]

## 2017-12-18 ENCOUNTER — Encounter: Payer: Self-pay | Admitting: Nurse Practitioner

## 2017-12-18 ENCOUNTER — Other Ambulatory Visit: Payer: Self-pay

## 2017-12-18 ENCOUNTER — Ambulatory Visit: Payer: BLUE CROSS/BLUE SHIELD | Admitting: Nurse Practitioner

## 2017-12-18 VITALS — BP 128/89 | HR 60 | Wt 220.4 lb

## 2017-12-18 DIAGNOSIS — G250 Essential tremor: Secondary | ICD-10-CM

## 2017-12-18 NOTE — Progress Notes (Signed)
I have read the note, and I agree with the clinical assessment and plan.  Roger Copeland   

## 2017-12-18 NOTE — Patient Instructions (Addendum)
Continue Inderal 60 ER daily this was oncreased by PCP Can use weighted utensils  Can for worsening tremor Follow-up yearly and when necessary  Essential Tremor A tremor is trembling or shaking that you cannot control. Most tremors affect the hands or arms. Tremors can also affect the head, vocal cords, face, and other parts of the body. Essential tremor is a tremor without a known cause. What are the causes? Essential tremor has no known cause. What increases the risk? You may be at greater risk of essential tremor if:  You have a family member with essential tremor.  You are age 81 or older.  You take certain medicines.  What are the signs or symptoms? The main sign of a tremor is uncontrolled and unintentional rhythmic shaking of a body part.  You may have difficulty eating with a spoon or fork.  You may have difficulty writing.  You may nod your head up and down or side to side.  You may have a quivering voice.  Your tremors:  May get worse over time.  May come and go.  May be more noticeable on one side of your body.  May get worse due to stress, fatigue, caffeine, and extreme heat or cold.  How is this diagnosed? Your health care provider can diagnose essential tremor based on your symptoms, medical history, and a physical examination. There is no single test to diagnose an essential tremor. However, your health care provider may perform a variety of tests to rule out other conditions. Tests may include:  Blood and urine tests.  Imaging studies of your brain, such as: ? CT scan. ? MRI.  A test that measures involuntary muscle movement (electromyogram).  How is this treated? Your tremors may go away without treatment. Mild tremors may not need treatment if they do not affect your day-to-day life. Severe tremors may need to be treated using one or a combination of the following options:  Medicines. This may include medicine that is injected.  Lifestyle  changes.  Physical therapy.  Follow these instructions at home:  Take medicines only as directed by your health care provider.  Limit alcohol intake to no more than 1 drink per day for nonpregnant women and 2 drinks per day for men. One drink equals 12 oz of beer, 5 oz of wine, or 1 oz of hard liquor.  Do not use any tobacco products, including cigarettes, chewing tobacco, or electronic cigarettes. If you need help quitting, ask your health care provider.  Take medicines only as directed by your health care provider.  Avoid extreme heat or cold.  Limit the amount of caffeine you consumeas directed by your health care provider.  Try to get eight hours of sleep each night.  Find ways to manage your stress, such as meditation or yoga.  Keep all follow-up visits as directed by your health care provider. This is important. This includes any physical therapy visits. Contact a health care provider if:  You experience any changes in the location or intensity of your tremors.  You start having a tremor after starting a new medicine.  You have tremor with other symptoms such as: ? Numbness. ? Tingling. ? Pain. ? Weakness.  Your tremor gets worse.  Your tremor interferes with your daily life. This information is not intended to replace advice given to you by your health care provider. Make sure you discuss any questions you have with your health care provider. Document Released: 02/25/2014 Document Revised: 07/13/2015 Document Reviewed:  08/02/2013 Elsevier Interactive Patient Education  [employer].

## 2018-05-06 ENCOUNTER — Other Ambulatory Visit: Payer: Self-pay | Admitting: Family Medicine

## 2018-05-12 ENCOUNTER — Telehealth: Payer: Self-pay

## 2018-05-12 NOTE — Telephone Encounter (Signed)
Verified with Pt his correct e-mail address and that he he was able to downloaded the app and told him he will receive an e-mail sometime today. Also told patient we will let him know when the email is sent and told him the link will be active 10 mins before his appointment and that he will not need to go to the app for the visit.

## 2018-05-13 ENCOUNTER — Other Ambulatory Visit: Payer: Self-pay | Admitting: Family Medicine

## 2018-05-13 ENCOUNTER — Other Ambulatory Visit: Payer: Self-pay

## 2018-05-13 ENCOUNTER — Ambulatory Visit (INDEPENDENT_AMBULATORY_CARE_PROVIDER_SITE_OTHER): Payer: PRIVATE HEALTH INSURANCE | Admitting: Family Medicine

## 2018-05-13 ENCOUNTER — Encounter: Payer: Self-pay | Admitting: Family Medicine

## 2018-05-13 ENCOUNTER — Ambulatory Visit: Payer: PRIVATE HEALTH INSURANCE | Admitting: Family Medicine

## 2018-05-13 DIAGNOSIS — G250 Essential tremor: Secondary | ICD-10-CM

## 2018-05-13 DIAGNOSIS — E7800 Pure hypercholesterolemia, unspecified: Secondary | ICD-10-CM

## 2018-05-13 MED ORDER — PROPRANOLOL HCL ER 80 MG PO CP24: 80.0000 mg | ORAL_CAPSULE | Freq: Every day | ORAL | 2 refills | Status: DC

## 2018-05-13 NOTE — Progress Notes (Signed)
Virtual Visit via Video Note  I connected with Roger Copeland on 05/13/18 at  7:30 AM EDT by a video enabled telemedicine application and verified that I am speaking with the correct person using two identifiers.   I discussed the limitations of evaluation and management by telemedicine and the availability of in person appointments. The patient expressed understanding and agreed to proceed.  History of Present Illness: Hyperlipidemia Patient presents for dyslipidemia follow up. Currently being treated with Crestor and compliance with treatment thus far has been good. He complains of intermittent LE myalgias. He is usually adhering to a healthy. Exercise: walking, weights The patient is not known to have coexisting coronary artery disease.  BET Taking propranolol ER 60 mg/d.  Observations/Objective: +intention tremor Age appropriate judgment and insight Appears comfortable  Assessment and Plan: Pure hypercholesterolemia  Essential tremor  Cont Crestor. Increase water intake. Consider stopping for 2-3 weeks. Counseled on diet and exercise. Increase propranolol to 80 mg/d.   Follow Up Instructions:    I discussed the assessment and treatment plan with the patient. The patient was provided an opportunity to ask questions and all were answered. The patient agreed with the plan and demonstrated an understanding of the instructions.   The patient was advised to call back or seek an in-person evaluation if the symptoms worsen or if the condition fails to improve as anticipated.  I provided 8 minutes of non-face-to-face time during this encounter.   Jilda Roche [place], DO

## 2018-06-01 ENCOUNTER — Telehealth: Payer: PRIVATE HEALTH INSURANCE | Admitting: Physician Assistant

## 2018-06-01 ENCOUNTER — Other Ambulatory Visit: Payer: Self-pay

## 2018-06-01 ENCOUNTER — Encounter: Payer: Self-pay | Admitting: Family Medicine

## 2018-06-01 ENCOUNTER — Encounter: Payer: Self-pay | Admitting: Physician Assistant

## 2018-06-01 ENCOUNTER — Ambulatory Visit (INDEPENDENT_AMBULATORY_CARE_PROVIDER_SITE_OTHER): Payer: PRIVATE HEALTH INSURANCE | Admitting: Family Medicine

## 2018-06-01 DIAGNOSIS — J301 Allergic rhinitis due to pollen: Secondary | ICD-10-CM | POA: Diagnosis not present

## 2018-06-01 DIAGNOSIS — R05 Cough: Secondary | ICD-10-CM

## 2018-06-01 DIAGNOSIS — R062 Wheezing: Secondary | ICD-10-CM

## 2018-06-01 DIAGNOSIS — R059 Cough, unspecified: Secondary | ICD-10-CM

## 2018-06-01 DIAGNOSIS — R079 Chest pain, unspecified: Secondary | ICD-10-CM

## 2018-06-01 MED ORDER — LEVOCETIRIZINE DIHYDROCHLORIDE 5 MG PO TABS: 5.0000 mg | ORAL_TABLET | Freq: Every evening | ORAL | 2 refills | Status: DC

## 2018-06-01 MED ORDER — PREDNISONE 20 MG PO TABS: 40.0000 mg | ORAL_TABLET | Freq: Every day | ORAL | 0 refills | Status: AC

## 2018-06-01 NOTE — Progress Notes (Addendum)
Virtual Visit via Video Note  I connected with Roger Copeland on 06/01/18 at  1:15 PM EDT by a video enabled telemedicine application and verified that I am speaking with the correct person using two identifiers.   I discussed the limitations of evaluation and management by telemedicine and the availability of in person appointments. The patient expressed understanding and agreed to proceed.  History of Present Illness: Cough Duration: 10 days  Associated symptoms: rhinorrhea, itchy watery eyes, wheezing, chest tightness and cough Denies: sinus congestion, sinus pain, ear pain, ear drainage, sore throat, shortness of breath, myalgia and fevers Treatment to date: Delsym, Mucinex Sick contacts: No   Observations/Objective: No conversational dyspnea Age appropriate judgment and insight Nml affect and mood +tremor  Assessment and Plan: Seasonal allergic rhinitis due to pollen - Plan: levocetirizine (XYZAL) 5 MG tablet  Wheezing - Plan: predniSONE (DELTASONE) 20 MG tablet  Orders as above. Start PO antihistamine. Let me know in 203 d if not starting to improve. Does not sound infectious at this time.  Follow Up Instructions: PRN   I discussed the assessment and treatment plan with the patient. The patient was provided an opportunity to ask questions and all were answered. The patient agreed with the plan and demonstrated an understanding of the instructions.   The patient was advised to call back or seek an in-person evaluation if the symptoms worsen or if the condition fails to improve as anticipated.   Jilda Roche [place], DO

## 2018-06-01 NOTE — Progress Notes (Signed)
Based on what you shared with me, I feel your condition warrants further evaluation and I recommend that you be seen for a face to face office visit.  Mr. Roger Copeland,  Your symptoms of cough, wheezing, and chest pain warrant further workup. I recommend having a fae to face evaluation to make sure your lungs are clear and to rule out pneumonia .     NOTE: If you entered your credit card information for this eVisit, you will not be charged. You may see a "hold" on your card for the $35 but that hold will drop off and you will not have a charge processed.  If you are having a true medical emergency please call 911.  If you need an urgent face to face visit, [place] has four urgent care centers for your convenience.    PLEASE NOTE: THE [facility] LOCATIONS AND URGENT CARE CLINICS DO NOT HAVE THE TESTING FOR CORONAVIRUS COVID19 AVAILABLE.  IF YOU FEEL YOU NEED THIS TEST YOU MUST GO TO A TRIAGE LOCATION AT ONE OF THE HOSPITAL EMERGENCY DEPARTMENTS   [email or web address] to reserve your spot online an avoid wait times  [facility] [address], [address] [place], [place] [address] Modified hours of operation: Monday-Friday, 10 AM to 6 PM  Saturday & Sunday 10 AM to 4 PM *Across the street from [employer]  [facility] (New Address!) [address] [place], [place] [address] *Just off [employer], across the road from [place]* Modified hours of operation: Monday-Friday, 10 AM to 5 PM  Closed Saturday & Sunday   The following sites will take your insurance:  . [facility]  [phone] Get Driving Directions Find a Provider at this Location  [address] [place], [place] [address] . 10 am to 8 pm Monday-Friday . 12 pm to 8 pm Saturday-Sunday   . [facility] at [facility]  [phone] Get Driving Directions Find a Provider at this Location  [address], [address]  [place], [place] [address] . 8 am to 8 pm Monday-Friday . 9 am to 6 pm Saturday . 11 am to 6 pm Sunday   . [facility] at [facility]  [phone] Get Driving Directions  [address] [place], [place] [address] . 8 am to 8 pm Monday-Friday . 8 am to 4 pm Saturday-Sunday   Your e-visit answers were reviewed by a board certified advanced clinical practitioner to complete your personal care plan.  Thank you for using e-Visits.  I have spent 7 min in completion and review of this note- Illa Level [facility]

## 2018-06-09 ENCOUNTER — Telehealth: Payer: PRIVATE HEALTH INSURANCE | Admitting: Physician Assistant

## 2018-06-09 DIAGNOSIS — J309 Allergic rhinitis, unspecified: Secondary | ICD-10-CM | POA: Diagnosis not present

## 2018-06-09 MED ORDER — FLUTICASONE PROPIONATE 50 MCG/ACT NA SUSP: 2.0000 | Freq: Every day | NASAL | 0 refills | Status: DC

## 2018-06-09 NOTE — Progress Notes (Signed)
E visit for Allergic Rhinitis We are sorry that you are not feeling well.  Here is how we plan to help!  Based on what you have shared with me it looks like you have Allergic Rhinitis.  Rhinitis is when a reaction occurs that causes nasal congestion, runny nose, sneezing, and itching.  Most types of rhinitis are caused by an inflammation and are associated with symptoms in the eyes ears or throat. There are several types of rhinitis.  The most common are acute rhinitis, which is usually caused by a viral illness, allergic or seasonal rhinitis, and nonallergic or year-round rhinitis.  Nasal allergies occur certain times of the year.  Allergic rhinitis is caused when allergens in the air trigger the release of histamine in the body.  Histamine causes itching, swelling, and fluid to build up in the fragile linings of the nasal passages, sinuses and eyelids.  An itchy nose and clear discharge are common.  I recommend the following over the counter treatments:   I also would recommend a nasal spray: Flonase 2 sprays into each nostril once daily.  I will prescribe for your convenience.    HOME CARE:  You can use an over-the-counter saline nasal spray as needed Avoid areas where there is heavy dust, mites, or molds Stay indoors on windy days during the pollen season Keep windows closed in home, at least in bedroom; use air conditioner. Use high-efficiency house air filter Keep windows closed in car, turn AC on re-circulate Avoid playing out with dog during pollen season  GET HELP RIGHT AWAY IF:  If your symptoms do not improve within 10 days You become short of breath You develop yellow or green discharge from your nose for over 3 days You have coughing fits  MAKE SURE YOU:  Understand these instructions Will watch your condition Will get help right away if you are not doing well or get worse  Thank you for choosing an e-visit. Your e-visit answers were reviewed by a board certified  advanced clinical practitioner to complete your personal care plan. Depending upon the condition, your plan could have included both over the counter or prescription medications. Please review your pharmacy choice. Be sure that the pharmacy you have chosen is open so that you can pick up your prescription now.  If there is a problem you may message your provider in MyChart to have the prescription routed to another pharmacy. Your safety is important to us. If you have drug allergies check your prescription carefully.  For the next 24 hours, you can use MyChart to ask questions about today's visit, request a non-urgent call back, or ask for a work or school excuse from your e-visit provider. You will get an email in the next two days asking about your experience. I hope that your e-visit has been valuable and will speed your recovery.         ===View-only below this line===   ----- Message -----    From: Patricia NettleJames Able    Sent: 06/09/2018 10:22 AM EDT      To: E-Visit Mailing List Subject: E-Visit Submission: Upper Respiratory Infection  E-Visit Submission: Upper Respiratory Infection --------------------------------  Question: Which of the following are you experiencing? Answer:   Congested nose            Runny nose            Sneezing            Cough  Post-nasal drip (mucus running down back of throat)  Question: How long have you been having these symptoms? Answer:   7 or more days  Question: How long have you been coughing? Answer:   7 or more days  Question: How would you describe the cough? Answer:   A cough from congested lungs  Question: Does the cough prevent you from sleeping at night? Answer:   Yes  Question: How often are you coughing? Answer:   In spasms that come and go  Question: Do you have a fever? Answer:   No, I do not have a fever  Question: Are you in close contact with anyone who has similar symptoms ? Answer:   No  Question: Are you  treated for any of the following conditions: Asthma, COPD, Diabetes, Renal Failure (on Dialysis), AIDS, any Neuromuscular disease that effects the clearing of secretions, Heart Failure, or Heart Disease? Answer:   No  Question: Are you taking any over the counter medications for your symptoms? Answer:   No  Question: Please list your medication allergies that you may have ? (If 'none' , please list as 'none') Answer:   None  Question: Please list any additional comments  Answer:   I am still taking prescription that Dr. Carmelia Roller prescribed.   Just can not get rid of it.  A total of 5-10 minutes was spent evaluating this patients questionnaire and formulating a plan of care.

## 2018-11-12 ENCOUNTER — Other Ambulatory Visit: Payer: Self-pay

## 2018-11-13 ENCOUNTER — Ambulatory Visit (INDEPENDENT_AMBULATORY_CARE_PROVIDER_SITE_OTHER): Payer: PRIVATE HEALTH INSURANCE | Admitting: Family Medicine

## 2018-11-13 ENCOUNTER — Encounter: Payer: Self-pay | Admitting: Family Medicine

## 2018-11-13 ENCOUNTER — Other Ambulatory Visit: Payer: Self-pay

## 2018-11-13 VITALS — BP 110/72 | HR 54 | Temp 96.2°F | Ht 71.0 in | Wt 218.1 lb

## 2018-11-13 DIAGNOSIS — Z23 Encounter for immunization: Secondary | ICD-10-CM

## 2018-11-13 DIAGNOSIS — Z0000 Encounter for general adult medical examination without abnormal findings: Secondary | ICD-10-CM

## 2018-11-13 LAB — COMPREHENSIVE METABOLIC PANEL
ALT: 19 U/L (ref 0–53)
AST: 15 U/L (ref 0–37)
Albumin: 4.3 g/dL (ref 3.5–5.2)
Alkaline Phosphatase: 56 U/L (ref 39–117)
BUN: 11 mg/dL (ref 6–23)
CO2: 32 mEq/L (ref 19–32)
Calcium: 9.6 mg/dL (ref 8.4–10.5)
Chloride: 100 mEq/L (ref 96–112)
Creatinine, Ser: 0.89 mg/dL (ref 0.40–1.50)
GFR: 87.2 mL/min (ref >60.00)
Glucose, Bld: 101 mg/dL — ABNORMAL HIGH (ref 70–99)
Potassium: 4.3 mEq/L (ref 3.5–5.1)
Sodium: 139 mEq/L (ref 135–145)
Total Bilirubin: 0.5 mg/dL (ref 0.2–1.2)
Total Protein: 6.6 g/dL (ref 6.0–8.3)

## 2018-11-13 LAB — LIPID PANEL
Cholesterol: 118 mg/dL (ref 0–200)
HDL: 38.9 mg/dL — ABNORMAL LOW (ref >39.00)
LDL Cholesterol: 63 mg/dL (ref 0–99)
NonHDL: 78.75
Total CHOL/HDL Ratio: 3
Triglycerides: 77 mg/dL (ref 0.0–149.0)
VLDL: 15.4 mg/dL (ref 0.0–40.0)

## 2018-11-13 LAB — CBC
HCT: 46.9 % (ref 39.0–52.0)
Hemoglobin: 15.6 g/dL (ref 13.0–17.0)
MCHC: 33.2 g/dL (ref 30.0–36.0)
MCV: 95.2 fl (ref 78.0–100.0)
Platelets: 209 10*3/uL (ref 150.0–400.0)
RBC: 4.92 Mil/uL (ref 4.22–5.81)
RDW: 13.1 % (ref 11.5–15.5)
WBC: 9 10*3/uL (ref 4.0–10.5)

## 2018-11-13 LAB — VITAMIN D 25 HYDROXY (VIT D DEFICIENCY, FRACTURES): VITD: 35.85 ng/mL (ref 30.00–100.00)

## 2018-11-13 MED ORDER — PROPRANOLOL HCL ER 120 MG PO CP24: 120.0000 mg | ORAL_CAPSULE | Freq: Every day | ORAL | 3 refills | Status: DC

## 2018-11-13 MED ORDER — ROSUVASTATIN CALCIUM 10 MG PO TABS: 10.0000 mg | ORAL_TABLET | Freq: Every day | ORAL | 3 refills | Status: DC

## 2018-11-13 MED ORDER — PROPRANOLOL HCL ER 80 MG PO CP24: 80.0000 mg | ORAL_CAPSULE | Freq: Every day | ORAL | 2 refills | Status: DC

## 2018-11-13 NOTE — Progress Notes (Signed)
Chief Complaint  Patient presents with  . Annual Exam    Well Male Roger Copeland is here for a complete physical.   His last physical was >1 year ago.  Current diet: in general, an "OK" diet.  Current exercise: weight resistance, walking, stairs. Weight trend: stable Daytime fatigue? No. Seat belt? Yes.    Health maintenance Shingrix- No Colonoscopy- Yes Tetanus- Yes HIV- Yes Hep C- Yes  Past Medical History:  Diagnosis Date  . Allergy   . Essential tremor   . Gastroesophageal reflux disease       Past Surgical History:  Procedure Laterality Date  . NO PAST SURGERIES      Medications  Current Outpatient Medications on File Prior to Visit  Medication Sig Dispense Refill  . fluticasone (FLONASE) 50 MCG/ACT nasal spray Place 2 sprays into both nostrils daily. 16 g 0  . levocetirizine (XYZAL) 5 MG tablet Take 1 tablet (5 mg total) by mouth every evening. 30 tablet 2  . Multiple Vitamin (MULTIVITAMIN) tablet Take 1 tablet by mouth daily.    Marland Kitchen omeprazole (PRILOSEC) 20 MG capsule Take 20 mg by mouth daily.    . propranolol ER (INDERAL LA) 80 MG 24 hr capsule Take 1 capsule (80 mg total) by mouth daily. 90 capsule 2  . rosuvastatin (CRESTOR) 10 MG tablet Take 1 tablet (10 mg total) by mouth daily. 90 tablet 3   Allergies Allergies  Allergen Reactions  . Bee Venom Hives    Family History Family History  Problem Relation Age of Onset  . Heart failure Mother   . Heart disease Mother   . Lung cancer Father   . Cancer Father     Review of Systems: Constitutional:  no fevers Eye:  no recent significant change in vision Ear/Nose/Mouth/Throat:  Ears:  no hearing loss Nose/Mouth/Throat:  no complaints of nasal congestion, no sore throat Cardiovascular:  no chest pain, no palpitations Respiratory:  no cough and no shortness of breath Gastrointestinal:  no abdominal pain, no change in bowel habits GU:  Male: negative for dysuria, frequency, and incontinence and negative  for prostate symptoms Musculoskeletal/Extremities:  no pain, redness, or swelling of the joints Integumentary (Skin/Breast):  no abnormal skin lesions reported Neurologic:  no headaches Endocrine: No unexpected weight changes Hematologic/Lymphatic:  no abnormal bleeding  Exam BP 110/72 (BP Location: Left Arm, Patient Position: Sitting, Cuff Size: Normal)   Pulse (!) 54   Temp (!) 96.2 F (35.7 C) (Temporal)   Ht 5\' 11"  (1.803 m)   Wt 218 lb 2 oz (98.9 kg)   SpO2 98%   BMI 30.42 kg/m  General:  well developed, well nourished, in no apparent distress Skin:  no significant moles, warts, or growths Head:  no masses, lesions, or tenderness Eyes:  pupils equal and round, sclera anicteric without injection Ears:  canals without lesions, TMs shiny without retraction, no obvious effusion, no erythema Nose:  nares patent, septum midline, mucosa normal Throat/Pharynx:  lips and gingiva without lesion; tongue and uvula midline; non-inflamed pharynx; no exudates or postnasal drainage Neck: neck supple without adenopathy, thyromegaly, or masses Lungs:  clear to auscultation, breath sounds equal bilaterally, no respiratory distress Cardio:  regular rate and rhythm, no LE edema, no bruits Abdomen:  abdomen soft, nontender; bowel sounds normal; no masses or organomegaly Rectal: Deferred Musculoskeletal:  symmetrical muscle groups noted without atrophy or deformity Extremities:  no clubbing, cyanosis, or edema, no deformities, no skin discoloration Neuro: +intentionl tremor; gait normal; deep tendon reflexes  normal and symmetric Psych: well oriented with normal range of affect and appropriate judgment/insight  Assessment and Plan  Well adult exam - Plan: CBC, Comprehensive metabolic panel, Lipid panel, Vitamin D (25 hydroxy)   Well 58 y.o. male. Counseled on diet and exercise. Counseled on risks and benefits of prostate cancer screening with PSA. The patient agrees to forego  testing. Immunizations, labs, and further orders as above. Shingrix rec'd at some point, not emergent though.  Follow up in 1 mo to reck tremors. The patient voiced understanding and agreement to the plan.  Jilda Roche [place], DO 11/13/18 7:54 AM

## 2018-11-13 NOTE — Addendum Note (Signed)
Addended by: Sharon Seller B on: 11/13/2018 08:12 AM   Modules accepted: Orders

## 2018-11-13 NOTE — Patient Instructions (Addendum)
Give [place] 2-3 business days to get the results of your labs back.   Keep the diet clean and stay active.  The new Shingrix vaccine (for shingles) is a 2 shot series. It can make people feel low energy, achy and almost like they have the flu for 48 hours after injection. Please plan accordingly when deciding on when to get this shot. Call our office for a nurse visit appointment to get this. The second shot of the series is less severe regarding the side effects, but it still lasts 48 hours.   Let me know if you are interested in having a referral to a surgeon for your hernia. Likewise with the obesity team if you are interested at any point. Just send me a MyChart message.   Let us know if you need anything.

## 2018-11-17 ENCOUNTER — Other Ambulatory Visit: Payer: Self-pay | Admitting: Family Medicine

## 2018-11-17 DIAGNOSIS — Z122 Encounter for screening for malignant neoplasm of respiratory organs: Secondary | ICD-10-CM

## 2018-11-17 NOTE — Progress Notes (Signed)
CT

## 2018-12-15 ENCOUNTER — Other Ambulatory Visit: Payer: Self-pay

## 2018-12-16 ENCOUNTER — Ambulatory Visit (INDEPENDENT_AMBULATORY_CARE_PROVIDER_SITE_OTHER): Payer: PRIVATE HEALTH INSURANCE | Admitting: Family Medicine

## 2018-12-16 ENCOUNTER — Ambulatory Visit (HOSPITAL_BASED_OUTPATIENT_CLINIC_OR_DEPARTMENT_OTHER)
Admission: RE | Admit: 2018-12-16 | Discharge: 2018-12-16 | Disposition: A | Discharge status: Home / Self Care | Payer: PRIVATE HEALTH INSURANCE | Source: Ambulatory Visit | Attending: Family Medicine | Admitting: Family Medicine

## 2018-12-16 ENCOUNTER — Encounter: Payer: Self-pay | Admitting: Family Medicine

## 2018-12-16 ENCOUNTER — Other Ambulatory Visit: Payer: Self-pay

## 2018-12-16 VITALS — BP 110/64 | HR 64 | Temp 96.4°F | Ht 72.0 in | Wt 218.0 lb

## 2018-12-16 DIAGNOSIS — Z122 Encounter for screening for malignant neoplasm of respiratory organs: Secondary | ICD-10-CM | POA: Diagnosis not present

## 2018-12-16 DIAGNOSIS — G250 Essential tremor: Secondary | ICD-10-CM

## 2018-12-16 MED ORDER — PROPRANOLOL HCL ER 80 MG PO CP24: 80.0000 mg | ORAL_CAPSULE | Freq: Every day | ORAL | 2 refills | Status: DC

## 2018-12-16 NOTE — Progress Notes (Signed)
Chief Complaint  Patient presents with  . Follow-up    Subjective: Patient is a 58 y.o. male here for fu tremor.  Inderal increased from 80 mg/d to 120 mg/d. No AE's, reports compliance. No improvement though. Not bothering enough to increase again or to change med. Has never been on Primidone or seen Neuro. No weakness. Hands are mainly affected.    ROS: Neuro: +tremor  Past Medical History:  Diagnosis Date  . Allergy   . Essential tremor   . Gastroesophageal reflux disease     Objective: BP 110/64 (BP Location: Left Arm, Patient Position: Sitting, Cuff Size: Normal)   Pulse 64   Temp (!) 96.4 F (35.8 C) (Temporal)   Ht 6' (1.829 m)   Wt 218 lb (98.9 kg)   SpO2 97%   BMI 29.57 kg/m  General: Awake, appears stated age Neuro: +b/l tremor when holding hands in air; no resting tremor, gait is normal, no cerebellar signs, grip strength adequate MSK: No atrophy or asymmetry Heart: brisk cap refill Lungs: No accessory muscle use Psych: Age appropriate judgment and insight, normal affect and mood  Assessment and Plan: Essential tremor - Plan: propranolol ER (INDERAL LA) 80 MG 24 hr capsule  Decrease dose to 80 mg/d. Hold off on changes. Offered to change to Primidone vs referral to neuro. He will let us know if he changes his mind. F/u in 5 mo for med ck.  The patient voiced understanding and agreement to the plan.  [place], DO 12/16/18  7:29 AM

## 2018-12-16 NOTE — Patient Instructions (Addendum)
Send me a message if you wish to change your medication (Primidone is an alternative medication to the propranolol you are on) or see a specialist Passenger transport manager).  Consider upper body weight resistance exercises.   Let us know if you need anything.

## 2018-12-17 ENCOUNTER — Encounter: Payer: Self-pay | Admitting: Family Medicine

## 2018-12-17 DIAGNOSIS — J439 Emphysema, unspecified: Secondary | ICD-10-CM | POA: Insufficient documentation

## 2018-12-21 ENCOUNTER — Ambulatory Visit: Payer: PRIVATE HEALTH INSURANCE | Admitting: Adult Health

## 2019-05-14 ENCOUNTER — Other Ambulatory Visit: Payer: Self-pay

## 2019-05-17 ENCOUNTER — Other Ambulatory Visit: Payer: Self-pay

## 2019-05-17 ENCOUNTER — Encounter: Payer: Self-pay | Admitting: Family Medicine

## 2019-05-17 ENCOUNTER — Ambulatory Visit: Payer: PRIVATE HEALTH INSURANCE | Admitting: Family Medicine

## 2019-05-17 VITALS — BP 122/82 | HR 56 | Temp 96.2°F | Ht 71.0 in | Wt 229.3 lb

## 2019-05-17 DIAGNOSIS — R739 Hyperglycemia, unspecified: Secondary | ICD-10-CM | POA: Diagnosis not present

## 2019-05-17 DIAGNOSIS — E7800 Pure hypercholesterolemia, unspecified: Secondary | ICD-10-CM

## 2019-05-17 DIAGNOSIS — G250 Essential tremor: Secondary | ICD-10-CM

## 2019-05-17 LAB — COMPREHENSIVE METABOLIC PANEL
ALT: 20 U/L (ref 0–53)
AST: 17 U/L (ref 0–37)
Albumin: 4.4 g/dL (ref 3.5–5.2)
Alkaline Phosphatase: 63 U/L (ref 39–117)
BUN: 11 mg/dL (ref 6–23)
CO2: 30 mEq/L (ref 19–32)
Calcium: 9.7 mg/dL (ref 8.4–10.5)
Chloride: 102 mEq/L (ref 96–112)
Creatinine, Ser: 0.88 mg/dL (ref 0.40–1.50)
GFR: 88.19 mL/min (ref >60.00)
Glucose, Bld: 106 mg/dL — ABNORMAL HIGH (ref 70–99)
Potassium: 4.2 mEq/L (ref 3.5–5.1)
Sodium: 138 mEq/L (ref 135–145)
Total Bilirubin: 0.6 mg/dL (ref 0.2–1.2)
Total Protein: 6.9 g/dL (ref 6.0–8.3)

## 2019-05-17 LAB — LIPID PANEL
Cholesterol: 137 mg/dL (ref 0–200)
HDL: 35.3 mg/dL — ABNORMAL LOW (ref >39.00)
LDL Cholesterol: 77 mg/dL (ref 0–99)
NonHDL: 101.35
Total CHOL/HDL Ratio: 4
Triglycerides: 123 mg/dL (ref 0.0–149.0)
VLDL: 24.6 mg/dL (ref 0.0–40.0)

## 2019-05-17 LAB — HEMOGLOBIN A1C: Hgb A1c MFr Bld: 6 % (ref 4.6–6.5)

## 2019-05-17 MED ORDER — PROPRANOLOL HCL ER 80 MG PO CP24: 80.0000 mg | ORAL_CAPSULE | Freq: Every day | ORAL | 2 refills | Status: DC

## 2019-05-17 MED ORDER — ROSUVASTATIN CALCIUM 10 MG PO TABS: 10.0000 mg | ORAL_TABLET | Freq: Every day | ORAL | 3 refills | Status: DC

## 2019-05-17 NOTE — Patient Instructions (Signed)
Give us 2-3 business days to get the results of your labs back.   Keep the diet clean and stay active.  Let us know if you need anything. 

## 2019-05-17 NOTE — Progress Notes (Signed)
Chief Complaint  Patient presents with  . Follow-up    3 MONTH    Subjective: Hyperlipidemia Patient presents for Hyperlipidemia follow up. Currently taking Crestor 10 mg/d and compliance with treatment thus far has been good. He denies myalgias. He is sometimes adhering to a healthy diet. Exercise: wt resistance, stretching, walking The patient is not known to have coexisting coronary artery disease.  +BET. Taking propranolol LA 80 mg/d, compliant. No AE's. Symptoms present, not bothersome enough to change medication.   Past Medical History:  Diagnosis Date  . Allergy   . Emphysema of lung (HCC)    Seen on CT chest 2020  . Essential tremor   . Gastroesophageal reflux disease     Objective: BP 122/82 (BP Location: Left Arm, Patient Position: Sitting, Cuff Size: Normal)   Pulse (!) 56   Temp (!) 96.2 F (35.7 C) (Temporal)   Ht 5\' 11"  (1.803 m)   Wt 229 lb 5 oz (104 kg)   SpO2 98%   BMI 31.98 kg/m  General: Awake, appears stated age HEENT: MMM Heart: RRR, no LE edema, no bruits Lungs: CTAB, no rales, wheezes or rhonchi. No accessory muscle use Psych: Age appropriate judgment and insight, normal affect and mood  Assessment and Plan: Pure hypercholesterolemia - Plan: Lipid panel, Comprehensive metabolic panel  Essential tremor - Plan: propranolol ER (INDERAL LA) 80 MG 24 hr capsule  Hyperglycemia - Plan: Hemoglobin A1c  1- Counseled on diet/exercise. Cont Crestor. Ck labs today. 2- Cont Inderal. Consider Mirapex in future. He did not do well with dosage increase to 120 mg/d so was moved back down. He does not wish to make a change today.  F/u in 6 mo for CPE or prn. The patient voiced understanding and agreement to the plan.  [place], DO 05/17/19  7:57 AM

## 2019-11-15 ENCOUNTER — Other Ambulatory Visit: Payer: Self-pay

## 2019-11-15 ENCOUNTER — Encounter: Payer: Self-pay | Admitting: Family Medicine

## 2019-11-15 ENCOUNTER — Ambulatory Visit (INDEPENDENT_AMBULATORY_CARE_PROVIDER_SITE_OTHER): Payer: PRIVATE HEALTH INSURANCE | Admitting: Family Medicine

## 2019-11-15 VITALS — BP 120/80 | HR 60 | Temp 97.9°F | Ht 72.0 in | Wt 221.5 lb

## 2019-11-15 DIAGNOSIS — R7303 Prediabetes: Secondary | ICD-10-CM

## 2019-11-15 DIAGNOSIS — E785 Hyperlipidemia, unspecified: Secondary | ICD-10-CM

## 2019-11-15 DIAGNOSIS — Z0000 Encounter for general adult medical examination without abnormal findings: Secondary | ICD-10-CM

## 2019-11-15 DIAGNOSIS — Z23 Encounter for immunization: Secondary | ICD-10-CM | POA: Diagnosis not present

## 2019-11-15 MED ORDER — ALUMINUM CHLORIDE 20 % EX SOLN: Freq: Every day | CUTANEOUS | 0 refills | Status: DC

## 2019-11-15 NOTE — Addendum Note (Signed)
Addended by: Scharlene Gloss B on: 11/15/2019 07:51 AM   Modules accepted: Orders

## 2019-11-15 NOTE — Progress Notes (Signed)
Chief Complaint  Patient presents with  . Annual Exam    Well Male Roger Copeland is here for a complete physical.   His last physical was >1 year ago.  Current diet: in general, diet could be better.  Current exercise: lifting, walking Weight trend: Has lost 9 lbs Fatigue out of ordinary? No. Seat belt? Yes.    Health maintenance Shingrix- No Colonoscopy- Yes Tetanus- Yes HIV- Yes Hep C- Yes   Past Medical History:  Diagnosis Date  . Allergy   . Emphysema of lung (HCC)    Seen on CT chest 2020  . Essential tremor   . Gastroesophageal reflux disease       Past Surgical History:  Procedure Laterality Date  . NO PAST SURGERIES      Medications  Current Outpatient Medications on File Prior to Visit  Medication Sig Dispense Refill  . Multiple Vitamin (MULTIVITAMIN) tablet Take 1 tablet by mouth daily.    Marland Kitchen omeprazole (PRILOSEC) 20 MG capsule Take 20 mg by mouth daily.    . propranolol ER (INDERAL LA) 80 MG 24 hr capsule Take 1 capsule (80 mg total) by mouth daily. 90 capsule 2  . rosuvastatin (CRESTOR) 10 MG tablet Take 1 tablet (10 mg total) by mouth daily. 90 tablet 3    Allergies Allergies  Allergen Reactions  . Bee Venom Hives    Family History Family History  Problem Relation Age of Onset  . Heart failure Mother   . Heart disease Mother   . Lung cancer Father   . Cancer Father     Review of Systems: Constitutional:  no fevers Eye:  no recent significant change in vision Ear/Nose/Mouth/Throat:  Ears:  no hearing loss Nose/Mouth/Throat:  no complaints of nasal congestion, no sore throat Cardiovascular:  no chest pain Respiratory:  no shortness of breath Gastrointestinal:  no change in bowel habits GU:  Male: negative for dysuria, frequency Musculoskeletal/Extremities:  no joint pain Integumentary (Skin/Breast):  no abnormal skin lesions reported Neurologic:  no headaches Endocrine: +excessive sweating at times Hematologic/Lymphatic:  no night  sweats  Exam BP 120/80 (BP Location: Left Arm, Patient Position: Sitting, Cuff Size: Normal)   Pulse 60   Temp 97.9 F (36.6 C) (Oral)   Ht 6' (1.829 m)   Wt 221 lb 8 oz (100.5 kg)   SpO2 97%   BMI 30.04 kg/m  General:  well developed, well nourished, in no apparent distress Skin:  no significant moles, warts, or growths Head:  no masses, lesions, or tenderness Eyes:  pupils equal and round, sclera anicteric without injection Ears:  canals without lesions, TMs shiny without retraction, no obvious effusion, no erythema Nose:  nares patent, septum midline, mucosa normal Throat/Pharynx:  lips and gingiva without lesion; tongue and uvula midline; non-inflamed pharynx; no exudates or postnasal drainage Neck: neck supple without adenopathy, thyromegaly, or masses Cardiac: RRR, no bruits, no LE edema Lungs:  clear to auscultation, breath sounds equal bilaterally, no respiratory distress Rectal: Deferred Musculoskeletal:  symmetrical muscle groups noted without atrophy or deformity Neuro:  gait normal; deep tendon reflexes normal and symmetric Psych: well oriented with normal range of affect and appropriate judgment/insight  Assessment and Plan  Well adult exam  Dyslipidemia - Plan: Lipid panel, Comprehensive metabolic panel  Prediabetes - Plan: Hemoglobin A1c  Well 59 y.o. male. Counseled on diet and exercise. Counseled on risks and benefits of prostate cancer screening with PSA. The patient agrees to forego testing. Flu shot today.  Immunizations, labs,  and further orders as above. Follow up in 6 mo. The patient voiced understanding and agreement to the plan.  Jilda Roche [place], DO 11/15/19 7:48 AM

## 2019-11-15 NOTE — Patient Instructions (Addendum)
Keep the diet clean and stay active.  Give us 2-3 business days to get the results of your labs back.   The new Shingrix vaccine (for shingles) is a 2 shot series. It can make people feel low energy, achy and almost like they have the flu for 48 hours after injection. Please plan accordingly when deciding on when to get this shot. Call our office for a nurse visit appointment to get this. The second shot of the series is less severe regarding the side effects, but it still lasts 48 hours.   Let us know if you need anything.  

## 2019-11-15 NOTE — Addendum Note (Signed)
Addended by: Mervin Kung A on: 11/15/2019 07:58 AM   Modules accepted: Orders

## 2019-11-16 LAB — COMPREHENSIVE METABOLIC PANEL
AG Ratio: 1.8 (calc) (ref 1.0–2.5)
ALT: 18 U/L (ref 9–46)
AST: 17 U/L (ref 10–35)
Albumin: 4.3 g/dL (ref 3.6–5.1)
Alkaline phosphatase (APISO): 61 U/L (ref 35–144)
BUN: 10 mg/dL (ref 7–25)
CO2: 28 mmol/L (ref 20–32)
Calcium: 9.7 mg/dL (ref 8.6–10.3)
Chloride: 102 mmol/L (ref 98–110)
Creat: 0.95 mg/dL (ref 0.70–1.25)
Globulin: 2.4 g/dL (calc) (ref 1.9–3.7)
Glucose, Bld: 104 mg/dL — ABNORMAL HIGH (ref 65–99)
Potassium: 4.5 mmol/L (ref 3.5–5.3)
Sodium: 139 mmol/L (ref 135–146)
Total Bilirubin: 0.5 mg/dL (ref 0.2–1.2)
Total Protein: 6.7 g/dL (ref 6.1–8.1)

## 2019-11-16 LAB — HEMOGLOBIN A1C
Hgb A1c MFr Bld: 5.9 % of total Hgb — ABNORMAL HIGH (ref <5.7)
Mean Plasma Glucose: 123 (calc)
eAG (mmol/L): 6.8 (calc)

## 2019-11-16 LAB — LIPID PANEL
Cholesterol: 123 mg/dL (ref <200)
HDL: 36 mg/dL — ABNORMAL LOW (ref >=40)
LDL Cholesterol (Calc): 66 mg/dL (calc)
Non-HDL Cholesterol (Calc): 87 mg/dL (calc) (ref <130)
Total CHOL/HDL Ratio: 3.4 (calc) (ref <5.0)
Triglycerides: 127 mg/dL (ref <150)

## 2020-05-15 ENCOUNTER — Ambulatory Visit (INDEPENDENT_AMBULATORY_CARE_PROVIDER_SITE_OTHER): Payer: PRIVATE HEALTH INSURANCE | Admitting: Family Medicine

## 2020-05-15 ENCOUNTER — Other Ambulatory Visit: Payer: Self-pay

## 2020-05-15 ENCOUNTER — Encounter: Payer: Self-pay | Admitting: Family Medicine

## 2020-05-15 VITALS — BP 120/80 | HR 65 | Temp 98.1°F | Ht 71.0 in | Wt 227.4 lb

## 2020-05-15 DIAGNOSIS — G250 Essential tremor: Secondary | ICD-10-CM | POA: Diagnosis not present

## 2020-05-15 DIAGNOSIS — E7800 Pure hypercholesterolemia, unspecified: Secondary | ICD-10-CM | POA: Diagnosis not present

## 2020-05-15 DIAGNOSIS — R7303 Prediabetes: Secondary | ICD-10-CM | POA: Insufficient documentation

## 2020-05-15 DIAGNOSIS — J439 Emphysema, unspecified: Secondary | ICD-10-CM | POA: Diagnosis not present

## 2020-05-15 LAB — COMPREHENSIVE METABOLIC PANEL
ALT: 25 U/L (ref 0–53)
AST: 20 U/L (ref 0–37)
Albumin: 4.4 g/dL (ref 3.5–5.2)
Alkaline Phosphatase: 60 U/L (ref 39–117)
BUN: 11 mg/dL (ref 6–23)
CO2: 32 mEq/L (ref 19–32)
Calcium: 9.9 mg/dL (ref 8.4–10.5)
Chloride: 100 mEq/L (ref 96–112)
Creatinine, Ser: 0.95 mg/dL (ref 0.40–1.50)
GFR: 86.41 mL/min (ref >60.00)
Glucose, Bld: 120 mg/dL — ABNORMAL HIGH (ref 70–99)
Potassium: 4.4 mEq/L (ref 3.5–5.1)
Sodium: 138 mEq/L (ref 135–145)
Total Bilirubin: 0.5 mg/dL (ref 0.2–1.2)
Total Protein: 6.8 g/dL (ref 6.0–8.3)

## 2020-05-15 LAB — LIPID PANEL
Cholesterol: 129 mg/dL (ref 0–200)
HDL: 34.9 mg/dL — ABNORMAL LOW (ref >39.00)
LDL Cholesterol: 66 mg/dL (ref 0–99)
NonHDL: 93.8
Total CHOL/HDL Ratio: 4
Triglycerides: 141 mg/dL (ref 0.0–149.0)
VLDL: 28.2 mg/dL (ref 0.0–40.0)

## 2020-05-15 LAB — HEMOGLOBIN A1C: Hgb A1c MFr Bld: 6.2 % (ref 4.6–6.5)

## 2020-05-15 NOTE — Progress Notes (Signed)
CC: Medication review  Subjective: Hyperlipidemia Patient presents for Hyperlipidemia follow up. Currently taking Crestor 10 mg/d and compliance with treatment thus far has been good. He denies myalgias. He is adhering to a healthy diet. Exercise: walking The patient is not known to have coexisting coronary artery disease.  Essential tremor Takes propranolol LA 80 mg/d. No AE's, reports compliance. Stil lhas some tremor but is functional. Does not wish to change dosage.  Past Medical History:  Diagnosis Date  . Allergy   . Emphysema of lung (HCC)    Seen on CT chest 2020  . Essential tremor   . Gastroesophageal reflux disease     Objective: BP 120/80 (BP Location: Left Arm, Patient Position: Sitting, Cuff Size: Normal)   Pulse 65   Temp 98.1 F (36.7 C) (Oral)   Ht 5\' 11"  (1.803 m)   Wt 227 lb 6 oz (103.1 kg)   SpO2 99%   BMI 31.71 kg/m  General: Awake, appears stated age HEENT: MMM Heart: RRR, no LE edema, no bruits Neuro: Tremor noted b/l when testing grip strength, grip strength 5/5 Lungs: CTAB, no rales, wheezes or rhonchi. No accessory muscle use Psych: Age appropriate judgment and insight, normal affect and mood  Assessment and Plan: Pure hypercholesterolemia - Plan: Comprehensive metabolic panel, Lipid panel  Essential tremor  Pulmonary emphysema, unspecified emphysema type (HCC)  Prediabetes - Plan: Hemoglobin A1c  1. Ck labs. Cont Crestor 10 mg/d. Counseled on diet/exercise. 2. Cont propranolol XL 80 mg/d.  F/u in 6 mo for CPE or prn. The patient voiced understanding and agreement to the plan.  [place], DO 05/15/20  7:31 AM

## 2020-05-15 NOTE — Patient Instructions (Addendum)
Keep the diet clean and stay active.  Give [place] 2-3 business days to get the results of your labs back.   The new Shingrix vaccine (for shingles) is a 2 shot series. It can make people feel low energy, achy and almost like they have the flu for 48 hours after injection. Please plan accordingly when deciding on when to get this shot. Call our office for a nurse visit appointment to get this. The second shot of the series is less severe regarding the side effects, but it still lasts 48 hours.   Keep the diet clean and stay active.

## 2020-05-18 IMAGING — CT CT CHEST LUNG CANCER SCREENING LOW DOSE W/O CM
2 of 3 series · 15 of 36 positions shown, 18 images · non-contrast
Comparison: None.

CLINICAL DATA: 56-year-old asymptomatic male current smoker with 44
pack-year smoking history.

EXAM:
CT CHEST WITHOUT CONTRAST LOW-DOSE FOR LUNG CANCER SCREENING
TECHNIQUE: Multidetector CT imaging of the chest was performed following the
standard protocol without IV contrast.

[Series 2: axial st · axial · 0.80mm/px · z∈[-316,-46]mm · 12 of 64 slices shown, 15 images]
[im 5/64  mediastinal]
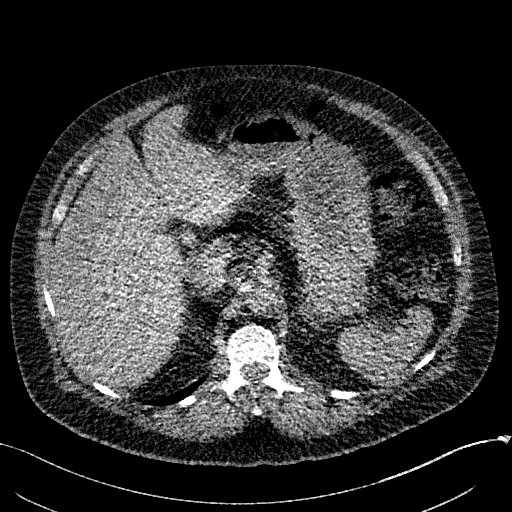
[im 5/64  lung]
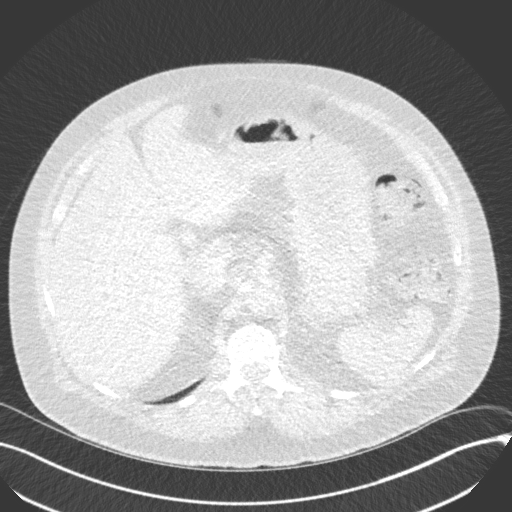
[im 10/64  lung]
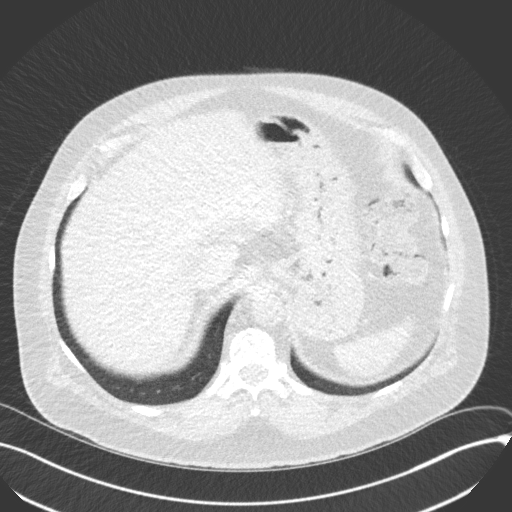
[im 15/64  lung]
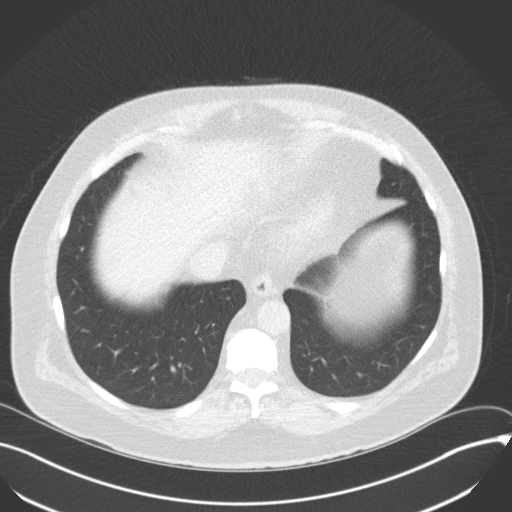
[im 19/64  lung]
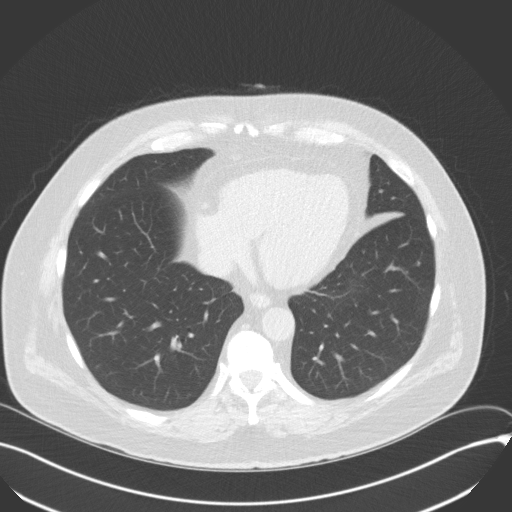
[im 24/64  mediastinal]
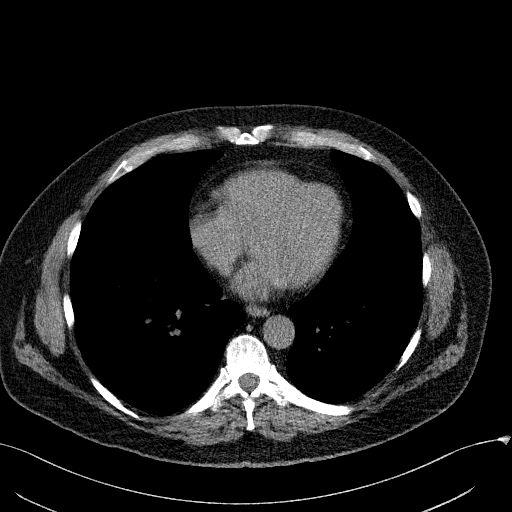
[im 24/64  lung]
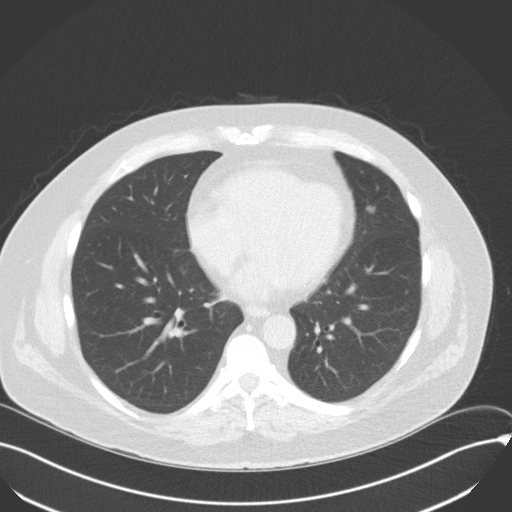
[im 29/64  lung]
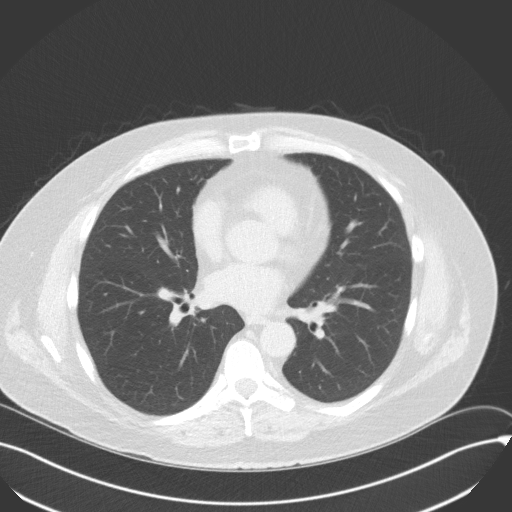
[im 36/64  lung]
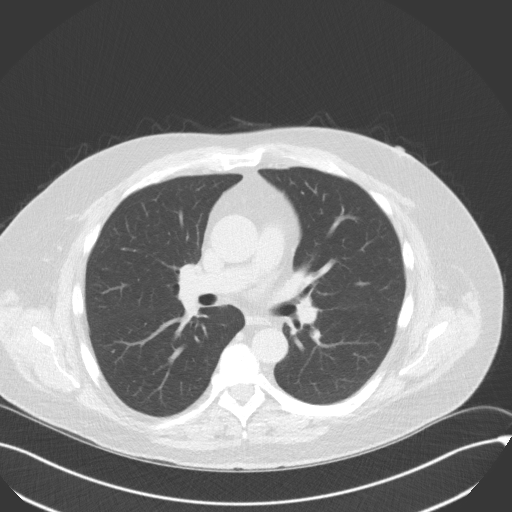
[im 40/64  lung]
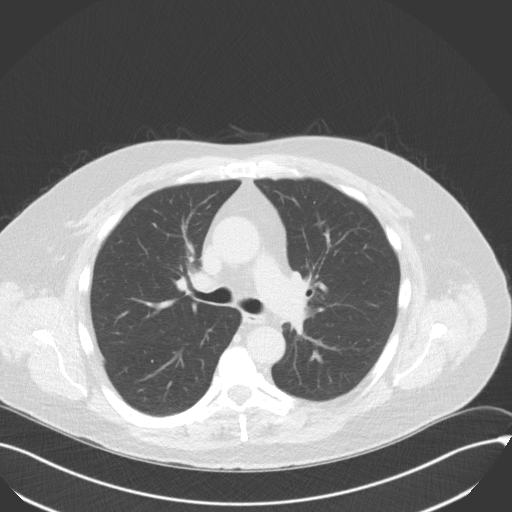
[im 45/64  mediastinal]
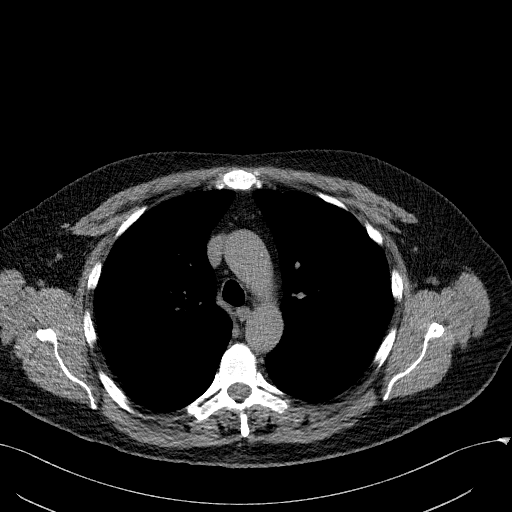
[im 45/64  lung]
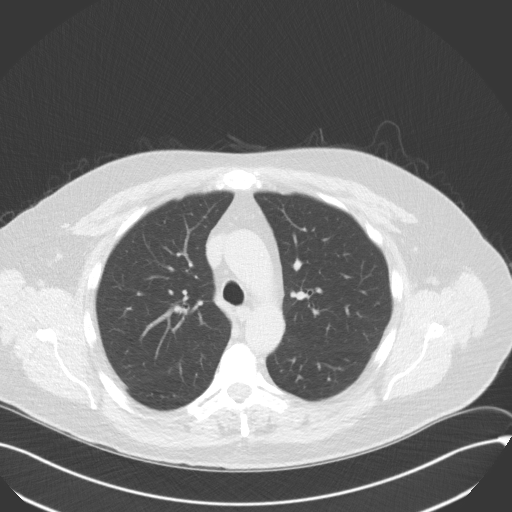
[im 50/64  lung]
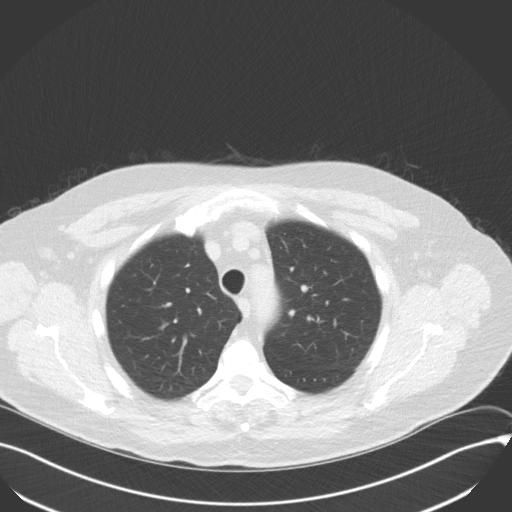
[im 54/64  lung]
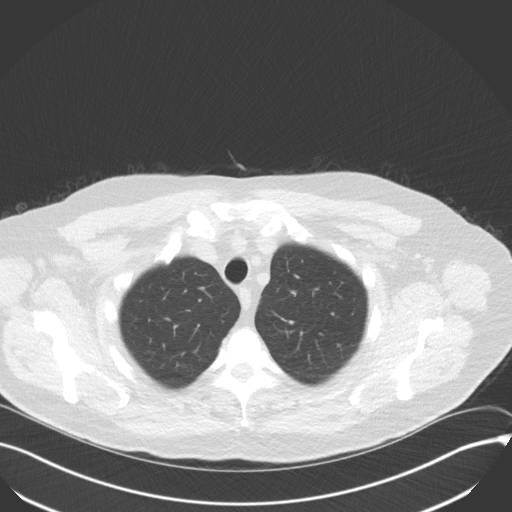
[im 59/64  lung]
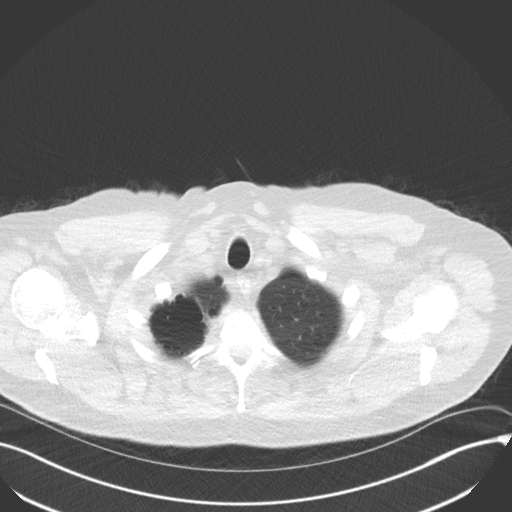

[Series 5: coronal · coronal · 0.64mm/px · 3 of 319 slices shown]
[im 64/319  lung]
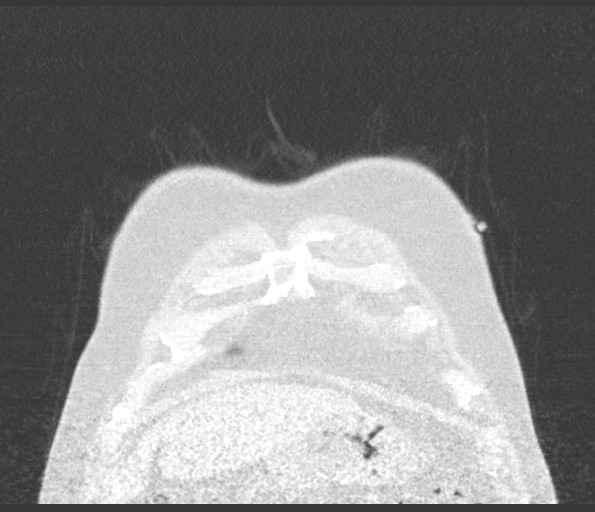
[im 128/319  lung]
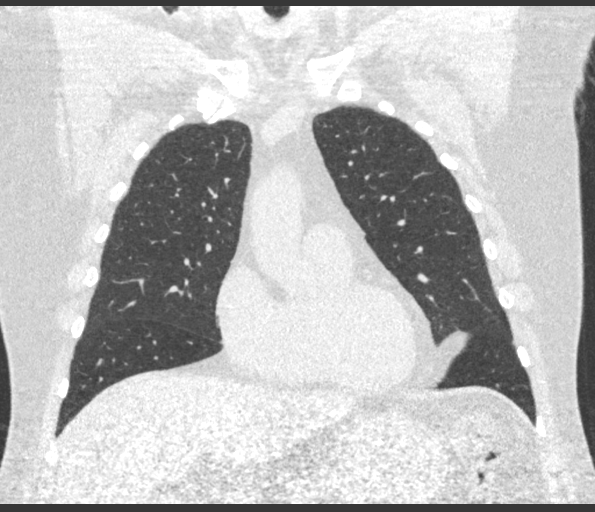
[im 191/319  lung]
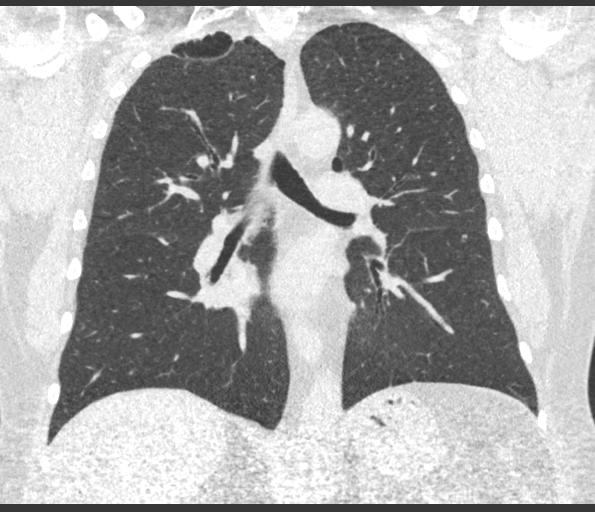

[15 of 36 positions shown; findings below may reference images not displayed]

FINDINGS: Cardiovascular: Normal heart size. No significant pericardial
effusion/thickening. Great vessels are normal in course and caliber.

Mediastinum/Nodes: No discrete thyroid nodules. Unremarkable
esophagus. No pathologically enlarged axillary, mediastinal or hilar
lymph nodes, noting limited sensitivity for the detection of hilar
adenopathy on this noncontrast study.

Lungs/Pleura: No pneumothorax. No pleural effusion. Mild
centrilobular and paraseptal emphysema. Mild bullous emphysema at
the right lung apex. No acute consolidative airspace disease or lung
masses. Several scattered solid pulmonary nodules in both lungs,
largest 5.8 mm in volume derived mean diameter in the right lower
lobe (series 3/image 152).

Upper abdomen: No acute abnormality.

Musculoskeletal: No aggressive appearing focal osseous lesions. Mild
thoracic spondylosis.
IMPRESSION: Lung-RADS 2, benign appearance or behavior. Continue annual
screening with low-dose chest CT without contrast in 12 months.

Emphysema ([id number]).

## 2020-06-27 ENCOUNTER — Other Ambulatory Visit: Payer: Self-pay | Admitting: Family Medicine

## 2020-06-27 DIAGNOSIS — G250 Essential tremor: Secondary | ICD-10-CM

## 2020-06-27 MED ORDER — ROSUVASTATIN CALCIUM 10 MG PO TABS: 10.0000 mg | ORAL_TABLET | Freq: Every day | ORAL | 3 refills | Status: DC

## 2020-06-27 MED ORDER — PROPRANOLOL HCL ER 80 MG PO CP24: 80.0000 mg | ORAL_CAPSULE | Freq: Every day | ORAL | 2 refills | Status: DC

## 2020-11-15 ENCOUNTER — Encounter: Payer: Self-pay | Admitting: Family Medicine

## 2020-11-15 ENCOUNTER — Other Ambulatory Visit: Payer: Self-pay

## 2020-11-15 ENCOUNTER — Ambulatory Visit (INDEPENDENT_AMBULATORY_CARE_PROVIDER_SITE_OTHER): Payer: No Typology Code available for payment source | Admitting: Family Medicine

## 2020-11-15 VITALS — BP 120/70 | HR 60 | Temp 97.9°F | Ht 71.0 in | Wt 234.0 lb

## 2020-11-15 DIAGNOSIS — Z0000 Encounter for general adult medical examination without abnormal findings: Secondary | ICD-10-CM

## 2020-11-15 DIAGNOSIS — R7303 Prediabetes: Secondary | ICD-10-CM

## 2020-11-15 DIAGNOSIS — Z23 Encounter for immunization: Secondary | ICD-10-CM | POA: Diagnosis not present

## 2020-11-15 DIAGNOSIS — K439 Ventral hernia without obstruction or gangrene: Secondary | ICD-10-CM

## 2020-11-15 LAB — COMPREHENSIVE METABOLIC PANEL
ALT: 24 U/L (ref 0–53)
AST: 20 U/L (ref 0–37)
Albumin: 4.3 g/dL (ref 3.5–5.2)
Alkaline Phosphatase: 64 U/L (ref 39–117)
BUN: 12 mg/dL (ref 6–23)
CO2: 32 mEq/L (ref 19–32)
Calcium: 9.9 mg/dL (ref 8.4–10.5)
Chloride: 99 mEq/L (ref 96–112)
Creatinine, Ser: 0.94 mg/dL (ref 0.40–1.50)
GFR: 87.2 mL/min (ref >60.00)
Glucose, Bld: 108 mg/dL — ABNORMAL HIGH (ref 70–99)
Potassium: 4.3 mEq/L (ref 3.5–5.1)
Sodium: 138 mEq/L (ref 135–145)
Total Bilirubin: 0.7 mg/dL (ref 0.2–1.2)
Total Protein: 7 g/dL (ref 6.0–8.3)

## 2020-11-15 LAB — CBC
HCT: 45.9 % (ref 39.0–52.0)
Hemoglobin: 15.5 g/dL (ref 13.0–17.0)
MCHC: 33.8 g/dL (ref 30.0–36.0)
MCV: 93.4 fl (ref 78.0–100.0)
Platelets: 225 10*3/uL (ref 150.0–400.0)
RBC: 4.92 Mil/uL (ref 4.22–5.81)
RDW: 13.4 % (ref 11.5–15.5)
WBC: 8.7 10*3/uL (ref 4.0–10.5)

## 2020-11-15 LAB — LIPID PANEL
Cholesterol: 136 mg/dL (ref 0–200)
HDL: 36.9 mg/dL — ABNORMAL LOW (ref >39.00)
LDL Cholesterol: 71 mg/dL (ref 0–99)
NonHDL: 99.53
Total CHOL/HDL Ratio: 4
Triglycerides: 145 mg/dL (ref 0.0–149.0)
VLDL: 29 mg/dL (ref 0.0–40.0)

## 2020-11-15 LAB — HEMOGLOBIN A1C: Hgb A1c MFr Bld: 6.5 % (ref 4.6–6.5)

## 2020-11-15 NOTE — Progress Notes (Signed)
Chief Complaint  Patient presents with   Annual Exam    Well Male Roger Copeland is here for a complete physical.   His last physical was >1 year ago.  Current diet: in general, a "healthy" diet.  Current exercise: yard work, active at work [employer] trend: gained a little Fatigue out of ordinary? No. Seat belt? Yes.    Health maintenance Shingrix- No Colonoscopy- Yes Tetanus- Yes HIV- Yes Hep C- Yes   Past Medical History:  Diagnosis Date   Allergy    Emphysema of lung (HCC)    Seen on CT chest 2020   Essential tremor    Gastroesophageal reflux disease       Past Surgical History:  Procedure Laterality Date   NO PAST SURGERIES      Medications  Current Outpatient Medications on File Prior to Visit  Medication Sig Dispense Refill   aluminum chloride (DRYSOL) 20 % external solution Apply topically at bedtime. 35 mL 0   Multiple Vitamin (MULTIVITAMIN) tablet Take 1 tablet by mouth daily.     omeprazole (PRILOSEC) 20 MG capsule Take 20 mg by mouth daily.     propranolol ER (INDERAL LA) 80 MG 24 hr capsule Take 1 capsule (80 mg total) by mouth daily. 90 capsule 2   rosuvastatin (CRESTOR) 10 MG tablet Take 1 tablet (10 mg total) by mouth daily. 90 tablet 3    Allergies Allergies  Allergen Reactions   Bee Venom Hives    Family History Family History  Problem Relation Age of Onset   Heart failure Mother    Heart disease Mother    Lung cancer Father    Cancer Father     Review of Systems: Constitutional:  no fevers Eye:  no recent significant change in vision Ear/Nose/Mouth/Throat:  Ears:  no hearing loss Nose/Mouth/Throat:  no complaints of nasal congestion, no sore throat Cardiovascular:  no chest pain Respiratory:  no shortness of breath Gastrointestinal:  no change in bowel habits GU:  Male: negative for dysuria, frequency Musculoskeletal/Extremities:  no joint pain Integumentary (Skin/Breast):  no abnormal skin lesions reported Neurologic:  no  headaches Endocrine: No unexpected weight changes Hematologic/Lymphatic:  no abnormal bleeding  Exam BP 120/70   Pulse 60   Temp 97.9 F (36.6 C) (Oral)   Ht 5\' 11"  (1.803 m)   Wt 234 lb (106.1 kg)   SpO2 99%   BMI 32.64 kg/m  General:  well developed, well nourished, in no apparent distress Skin:  no significant moles, warts, or growths Head:  no masses, lesions, or tenderness Eyes:  pupils equal and round, sclera anicteric without injection Ears:  canals without lesions, TMs shiny without retraction, no obvious effusion, no erythema Nose:  nares patent, septum midline, mucosa normal Throat/Pharynx:  lips and gingiva without lesion; tongue and uvula midline; non-inflamed pharynx; no exudates or postnasal drainage Neck: neck supple without adenopathy, thyromegaly, or masses Cardiac: RRR, no bruits, no LE edema Lungs:  clear to auscultation, breath sounds equal bilaterally, no respiratory distress Abdomen: BS+, soft, non-tender, non-distended, small bulge RUQ 1 in from umbilicus Rectal: Deferred Musculoskeletal:  symmetrical muscle groups noted without atrophy or deformity Neuro:  gait normal; deep tendon reflexes normal and symmetric Psych: well oriented with normal range of affect and appropriate judgment/insight  Assessment and Plan  Well adult exam - Plan: CBC, Comprehensive metabolic panel, Lipid panel  Prediabetes - Plan: Hemoglobin A1c  Need for influenza vaccination - Plan: Flu Vaccine QUAD 6+ mos PF IM (Fluarix Quad  PF)   Well 60 y.o. male. Counseled on diet and exercise. Counseled on risks and benefits of prostate cancer screening with PSA. The patient agrees to forego testing. Immunizations, labs, and further orders as above. Shingrix rec'd. Flu shot today.  Standing offer for gen surg referral for hernia. He can message if/when interested.  Follow up in 6 mo or prn. The patient voiced understanding and agreement to the plan.  Roger Roche [place],  DO 11/15/20 7:33 AM

## 2020-11-15 NOTE — Patient Instructions (Addendum)
Give [place] 2-3 business days to get the results of your labs back.   Keep the diet clean and stay active.  The new Shingrix vaccine (for shingles) is a 2 shot series. It can make people feel low energy, achy and almost like they have the flu for 48 hours after injection. Please plan accordingly when deciding on when to get this shot. Call our office for a nurse visit appointment to get this. The second shot of the series is less severe regarding the side effects, but it still lasts 48 hours.   Send me a message if you would like a referral to the general surgery team to have your ventral hernia taken care of. Remind me that we had discussed it at an appointment.   Let us know if you need anything.

## 2020-11-29 ENCOUNTER — Ambulatory Visit: Payer: No Typology Code available for payment source | Admitting: Family Medicine

## 2020-12-07 ENCOUNTER — Ambulatory Visit (INDEPENDENT_AMBULATORY_CARE_PROVIDER_SITE_OTHER): Payer: No Typology Code available for payment source | Admitting: Family Medicine

## 2020-12-07 ENCOUNTER — Encounter: Payer: Self-pay | Admitting: Family Medicine

## 2020-12-07 ENCOUNTER — Other Ambulatory Visit: Payer: Self-pay

## 2020-12-07 VITALS — BP 120/84 | HR 60 | Temp 97.3°F | Resp 20 | Ht 71.0 in | Wt 232.8 lb

## 2020-12-07 DIAGNOSIS — B353 Tinea pedis: Secondary | ICD-10-CM | POA: Diagnosis not present

## 2020-12-07 DIAGNOSIS — E669 Obesity, unspecified: Secondary | ICD-10-CM | POA: Insufficient documentation

## 2020-12-07 DIAGNOSIS — E1169 Type 2 diabetes mellitus with other specified complication: Secondary | ICD-10-CM

## 2020-12-07 DIAGNOSIS — Z23 Encounter for immunization: Secondary | ICD-10-CM

## 2020-12-07 MED ORDER — KETOCONAZOLE 2 % EX CREA: 1.0000 "application " | TOPICAL_CREAM | Freq: Every day | CUTANEOUS | 0 refills | Status: DC

## 2020-12-07 NOTE — Addendum Note (Signed)
Addended by: Roxanne Gates on: 12/07/2020 04:13 PM   Modules accepted: Orders

## 2020-12-07 NOTE — Patient Instructions (Signed)
Give [place] 2-3 business days to get the results of your labs back.   Make sure your eye doctor is aware of your condition.  Keep the diet clean and stay active.  Aim to do some physical exertion for 150 minutes per week. This is typically divided into 5 days per week, 30 minutes per day. The activity should be enough to get your heart rate up. Anything is better than nothing if you have time constraints.  Let us know if you need anything.

## 2020-12-07 NOTE — Progress Notes (Signed)
Chief Complaint  Patient presents with   Discuss Labs    Subjective: Patient is a 60 y.o. male here for follow-up.  A1c 6.5.  He is on Crestor.  He is not on any oral hypoglycemic's.  Diet is fair, he is active at work.  No chest pain or shortness of breath.  He does have an eye doctor and his last visit was 4 months ago.  He has never had the PCV 20.  Past Medical History:  Diagnosis Date   Allergy    Emphysema of lung (HCC)    Seen on CT chest 2020   Essential tremor    Gastroesophageal reflux disease     Objective: BP 120/84 (BP Location: Left Arm, Patient Position: Sitting, Cuff Size: Normal)   Pulse 60   Temp (!) 97.3 F (36.3 C) (Oral)   Resp 20   Ht 5\' 11"  (1.803 m)   Wt 232 lb 12.8 oz (105.6 kg)   SpO2 98%   BMI 32.47 kg/m  General: Awake, appears stated age Skin: Macerated tissues between 3/4th and 4/5th digits on the right foot, fourth/fifth digit on the left foot.  No other lesions on the foot noted. Neuro: Sensation intact to pinprick bilaterally over the feet Heart: 2+ DP pulses bilaterally Lungs: No accessory muscle use Psych: Age appropriate judgment and insight, normal affect and mood  Assessment and Plan: Diabetes mellitus type 2 in obese (HCC) - Plan: Microalbumin / creatinine urine ratio  Tinea pedis of both feet - Plan: ketoconazole (NIZORAL) 2 % cream  PCV 20, microalbumin creatinine ratio, he has an eye doctor, relatively normal foot exam today, he is on a statin.  Does not need to check sugars at this time.  Recheck as originally scheduled in March. The patient voiced understanding and agreement to the plan.  April Buford, DO 12/07/20  3:44 PM

## 2020-12-08 LAB — MICROALBUMIN / CREATININE URINE RATIO
Creatinine,U: 57.5 mg/dL
Microalb Creat Ratio: 1.2 mg/g (ref 0.0–30.0)
Microalb, Ur: <0.7 mg/dL (ref 0.0–1.9)

## 2021-01-19 ENCOUNTER — Encounter: Payer: Self-pay | Admitting: Family Medicine

## 2021-04-01 ENCOUNTER — Other Ambulatory Visit: Payer: Self-pay | Admitting: Family Medicine

## 2021-04-01 DIAGNOSIS — G250 Essential tremor: Secondary | ICD-10-CM

## 2021-05-16 ENCOUNTER — Ambulatory Visit (INDEPENDENT_AMBULATORY_CARE_PROVIDER_SITE_OTHER): Payer: No Typology Code available for payment source | Admitting: Family Medicine

## 2021-05-16 ENCOUNTER — Encounter: Payer: Self-pay | Admitting: Family Medicine

## 2021-05-16 VITALS — BP 120/84 | HR 69 | Temp 98.0°F | Ht 69.0 in | Wt 230.1 lb

## 2021-05-16 DIAGNOSIS — G250 Essential tremor: Secondary | ICD-10-CM | POA: Diagnosis not present

## 2021-05-16 DIAGNOSIS — B353 Tinea pedis: Secondary | ICD-10-CM

## 2021-05-16 DIAGNOSIS — E1169 Type 2 diabetes mellitus with other specified complication: Secondary | ICD-10-CM

## 2021-05-16 DIAGNOSIS — J439 Emphysema, unspecified: Secondary | ICD-10-CM

## 2021-05-16 DIAGNOSIS — E669 Obesity, unspecified: Secondary | ICD-10-CM

## 2021-05-16 DIAGNOSIS — E7800 Pure hypercholesterolemia, unspecified: Secondary | ICD-10-CM | POA: Diagnosis not present

## 2021-05-16 DIAGNOSIS — E119 Type 2 diabetes mellitus without complications: Secondary | ICD-10-CM

## 2021-05-16 LAB — COMPREHENSIVE METABOLIC PANEL
ALT: 24 U/L (ref 0–53)
AST: 20 U/L (ref 0–37)
Albumin: 4.4 g/dL (ref 3.5–5.2)
Alkaline Phosphatase: 62 U/L (ref 39–117)
BUN: 14 mg/dL (ref 6–23)
CO2: 31 mEq/L (ref 19–32)
Calcium: 9.4 mg/dL (ref 8.4–10.5)
Chloride: 100 mEq/L (ref 96–112)
Creatinine, Ser: 0.95 mg/dL (ref 0.40–1.50)
GFR: 85.8 mL/min (ref >60.00)
Glucose, Bld: 102 mg/dL — ABNORMAL HIGH (ref 70–99)
Potassium: 4.1 mEq/L (ref 3.5–5.1)
Sodium: 137 mEq/L (ref 135–145)
Total Bilirubin: 0.5 mg/dL (ref 0.2–1.2)
Total Protein: 6.8 g/dL (ref 6.0–8.3)

## 2021-05-16 LAB — LIPID PANEL
Cholesterol: 121 mg/dL (ref 0–200)
HDL: 35.4 mg/dL — ABNORMAL LOW (ref >39.00)
LDL Cholesterol: 65 mg/dL (ref 0–99)
NonHDL: 86
Total CHOL/HDL Ratio: 3
Triglycerides: 106 mg/dL (ref 0.0–149.0)
VLDL: 21.2 mg/dL (ref 0.0–40.0)

## 2021-05-16 LAB — HEMOGLOBIN A1C: Hgb A1c MFr Bld: 6.5 % (ref 4.6–6.5)

## 2021-05-16 MED ORDER — FLUTICASONE PROPIONATE 50 MCG/ACT NA SUSP: 2.0000 | Freq: Every day | NASAL | 6 refills | Status: AC

## 2021-05-16 MED ORDER — KETOCONAZOLE 2 % EX CREA: 1.0000 "application " | TOPICAL_CREAM | Freq: Every day | CUTANEOUS | 0 refills | Status: AC

## 2021-05-16 NOTE — Patient Instructions (Addendum)
Give us 2-3 business days to get the results of your labs back.   Keep the diet clean and stay active.  Please get me a copy of your advanced directive form at your convenience.   Claritin (loratadine), Allegra (fexofenadine), Zyrtec (cetirizine) which is also equivalent to Xyzal (levocetirizine); these are listed in order from weakest to strongest. Generic, and therefore cheaper, options are in the parentheses.   Flonase (fluticasone); nasal spray that is over the counter. 2 sprays each nostril, once daily. Aim towards the same side eye when you spray.  There are available OTC, and the generic versions, which may be cheaper, are in parentheses. Show this to a pharmacist if you have trouble finding any of these items.  Let us know if you need anything.  

## 2021-05-16 NOTE — Progress Notes (Signed)
Subjective:  ? ?Chief Complaint  ?Patient presents with  ? Follow-up  ?  6 month ?Allergies ?  ? ? ?Roger Copeland is a 61 y.o. male here for follow-up of diabetes.   ?[name] does not monitor his sugars at home.  ?Patient does not require insulin.   ?Medications include: diet controlled ?Diet is fair.  ?Exercise: lifting wts, some cardio ? ?Hyperlipidemia ?Patient presents for dyslipidemia follow up. ?Currently being treated with Crestor 10 mg/d and compliance with treatment thus far has been good. ?He denies myalgias.The patient is not known to have coexisting coronary artery disease. ? ?Tremor ?Taking Inderal 80 mg/d. No AE's, compliant.  ? ?Past Medical History:  ?Diagnosis Date  ? Allergy   ? Emphysema of lung (HCC)   ? Seen on CT chest 2020  ? Essential tremor   ? Gastroesophageal reflux disease   ?  ? ?Related testing: ?Retinal exam: Done ?Pneumovax: done ? ?Objective:  ?BP 120/84   Pulse 69   Temp 98 ?F (36.7 ?C) (Oral)   Ht 5\' 9"  (1.753 m)   Wt 230 lb 2 oz (104.4 kg)   SpO2 98%   BMI 33.98 kg/m?  ?General:  Well developed, well nourished, in no apparent distress ?Skin:  Warm, no pallor or diaphoresis ?Head:  Normocephalic, atraumatic ?Eyes:  Pupils equal and round, sclera anicteric without injection  ?Lungs:  CTAB, no access msc use ?Cardio:  RRR, no bruits, no LE edema ?Musculoskeletal:  Symmetrical muscle groups noted without atrophy or deformity ?Neuro:  Sensation intact to pinprick on feet ?Psych: Age appropriate judgment and insight ? ?Assessment:  ? ?Diabetes mellitus type 2 in obese (HCC), Chronic - Plan: Hemoglobin A1c, Lipid panel, Comprehensive metabolic panel ? ?Pure hypercholesterolemia ? ?Essential tremor ? ?Tinea pedis of both feet - Plan: ketoconazole (NIZORAL) 2 % cream ? ?Pulmonary emphysema, unspecified emphysema type (HCC), Chronic  ? ?Plan:  ? ?Chronic, stable. Diet controlled. Counseled on diet and exercise. ?Chronic, stable. Cont Crestor 10 mg/d.  ?Chronic, stable for now. Cont  Inderal LA 80 mg/d.  ?Advanced directive form provided today.  ?F/u in 6 mo. ?The patient voiced understanding and agreement to the plan. ? ? , DO ?05/16/21 ?7:39 AM ? ?

## 2021-06-30 ENCOUNTER — Other Ambulatory Visit: Payer: Self-pay | Admitting: Family Medicine

## 2021-11-20 ENCOUNTER — Encounter: Payer: Self-pay | Admitting: Family Medicine

## 2021-11-20 ENCOUNTER — Ambulatory Visit (INDEPENDENT_AMBULATORY_CARE_PROVIDER_SITE_OTHER): Payer: No Typology Code available for payment source | Admitting: Family Medicine

## 2021-11-20 VITALS — BP 120/80 | HR 67 | Temp 98.0°F | Ht 72.0 in | Wt 227.2 lb

## 2021-11-20 DIAGNOSIS — K439 Ventral hernia without obstruction or gangrene: Secondary | ICD-10-CM | POA: Diagnosis not present

## 2021-11-20 DIAGNOSIS — Z125 Encounter for screening for malignant neoplasm of prostate: Secondary | ICD-10-CM | POA: Diagnosis not present

## 2021-11-20 DIAGNOSIS — Z0000 Encounter for general adult medical examination without abnormal findings: Secondary | ICD-10-CM | POA: Diagnosis not present

## 2021-11-20 DIAGNOSIS — Z23 Encounter for immunization: Secondary | ICD-10-CM | POA: Diagnosis not present

## 2021-11-20 DIAGNOSIS — E669 Obesity, unspecified: Secondary | ICD-10-CM | POA: Diagnosis not present

## 2021-11-20 DIAGNOSIS — G250 Essential tremor: Secondary | ICD-10-CM | POA: Diagnosis not present

## 2021-11-20 DIAGNOSIS — E1169 Type 2 diabetes mellitus with other specified complication: Secondary | ICD-10-CM | POA: Diagnosis not present

## 2021-11-20 LAB — COMPREHENSIVE METABOLIC PANEL
ALT: 21 U/L (ref 0–53)
AST: 17 U/L (ref 0–37)
Albumin: 4.4 g/dL (ref 3.5–5.2)
Alkaline Phosphatase: 63 U/L (ref 39–117)
BUN: 13 mg/dL (ref 6–23)
CO2: 29 mEq/L (ref 19–32)
Calcium: 9.7 mg/dL (ref 8.4–10.5)
Chloride: 101 mEq/L (ref 96–112)
Creatinine, Ser: 0.9 mg/dL (ref 0.40–1.50)
GFR: 91.22 mL/min (ref >60.00)
Glucose, Bld: 101 mg/dL — ABNORMAL HIGH (ref 70–99)
Potassium: 4.4 mEq/L (ref 3.5–5.1)
Sodium: 139 mEq/L (ref 135–145)
Total Bilirubin: 0.8 mg/dL (ref 0.2–1.2)
Total Protein: 7 g/dL (ref 6.0–8.3)

## 2021-11-20 LAB — LIPID PANEL
Cholesterol: 129 mg/dL (ref 0–200)
HDL: 37.6 mg/dL — ABNORMAL LOW (ref >39.00)
LDL Cholesterol: 68 mg/dL (ref 0–99)
NonHDL: 91.72
Total CHOL/HDL Ratio: 3
Triglycerides: 117 mg/dL (ref 0.0–149.0)
VLDL: 23.4 mg/dL (ref 0.0–40.0)

## 2021-11-20 LAB — MICROALBUMIN / CREATININE URINE RATIO
Creatinine,U: 128.8 mg/dL
Microalb Creat Ratio: 0.5 mg/g (ref 0.0–30.0)
Microalb, Ur: <0.7 mg/dL (ref 0.0–1.9)

## 2021-11-20 LAB — CBC
HCT: 44.8 % (ref 39.0–52.0)
Hemoglobin: 15 g/dL (ref 13.0–17.0)
MCHC: 33.4 g/dL (ref 30.0–36.0)
MCV: 94.6 fl (ref 78.0–100.0)
Platelets: 210 10*3/uL (ref 150.0–400.0)
RBC: 4.73 Mil/uL (ref 4.22–5.81)
RDW: 13.5 % (ref 11.5–15.5)
WBC: 11.6 10*3/uL — ABNORMAL HIGH (ref 4.0–10.5)

## 2021-11-20 LAB — PSA: PSA: 0.78 ng/mL (ref 0.10–4.00)

## 2021-11-20 LAB — HEMOGLOBIN A1C: Hgb A1c MFr Bld: 6.5 % (ref 4.6–6.5)

## 2021-11-20 MED ORDER — PROPRANOLOL HCL ER 120 MG PO CP24: 120.0000 mg | ORAL_CAPSULE | Freq: Every day | ORAL | 2 refills | Status: DC

## 2021-11-20 NOTE — Patient Instructions (Signed)
Give [place] 2-3 business days to get the results of your labs back.   Keep the diet clean and stay active.  Let me know if you would like to see a general surgeon for your hernia. Just send me a message. It is not mandatory medically.   If you are doing well with the higher strength of the propranolol, I will see you in 6 months. If not, I would like to see you in around 1 month.  Let us know if you need anything.

## 2021-11-20 NOTE — Progress Notes (Signed)
Chief Complaint  Patient presents with   Annual Exam    Well Male Roger Copeland is here for a complete physical.   His last physical was >1 year ago.  Current diet: in general, a "healthy" diet.  Current exercise: walking, some lifting Weight trend: stable Fatigue out of ordinary? No. Seat belt? Yes.   Advanced directive? Yes, brought copy today.   Health maintenance Shingrix- Yes Colonoscopy- Yes Tetanus- Yes HIV- Yes Hep C- Yes  Essential tremor Patient has a history of benign essential tremor, mainly affecting both of his hands when he moves them.  He has been well controlled on propanolol LA 80 mg daily until the last few months.  He is compliant with the medication and reports no adverse effects.  He is interested in adjusting medication as it is affecting his hand movements/activity.   Past Medical History:  Diagnosis Date   Allergy    Emphysema of lung ([place])    Seen on CT chest 2020   Essential tremor    Gastroesophageal reflux disease     Past Surgical History:  Procedure Laterality Date   NO PAST SURGERIES     Medications  Current Outpatient Medications on File Prior to Visit  Medication Sig Dispense Refill   fluticasone (FLONASE) 50 MCG/ACT nasal spray Place 2 sprays into both nostrils daily. 16 g 6   Multiple Vitamin (MULTIVITAMIN) tablet Take 1 tablet by mouth daily.     omeprazole (PRILOSEC) 20 MG capsule Take 20 mg by mouth daily.     propranolol ER (INDERAL LA) 80 MG 24 hr capsule TAKE 1 CAPSULE(80 MG) BY MOUTH DAILY 90 capsule 2   rosuvastatin (CRESTOR) 10 MG tablet TAKE 1 TABLET(10 MG) BY MOUTH DAILY 90 tablet 3   Allergies Allergies  Allergen Reactions   Bee Venom Hives    Family History Family History  Problem Relation Age of Onset   Heart failure Mother    Heart disease Mother    Lung cancer Father    Cancer Father     Review of Systems: Constitutional:  no fevers Eye:  no recent significant change in vision Ear/Nose/Mouth/Throat:   Ears:  no hearing loss Nose/Mouth/Throat:  no complaints of nasal congestion, no sore throat Cardiovascular:  no chest pain Respiratory:  no shortness of breath Gastrointestinal:  no change in bowel habits GU:  Male: negative for dysuria, frequency Musculoskeletal/Extremities:  no joint pain Integumentary (Skin/Breast):  no abnormal skin lesions reported Neurologic: +worsening tremor Endocrine: No unexpected weight changes Hematologic/Lymphatic:  no abnormal bleeding  Exam BP 120/80 (BP Location: Left Arm, Patient Position: Sitting, Cuff Size: Normal)   Pulse 67   Temp 98 F (36.7 C) (Oral)   Ht 6' (1.829 m)   Wt 227 lb 4 oz (103.1 kg)   SpO2 98%   BMI 30.82 kg/m  General:  well developed, well nourished, in no apparent distress Skin:  no significant moles, warts, or growths Head:  no masses, lesions, or tenderness Eyes:  pupils equal and round, sclera anicteric without injection Ears:  canals without lesions, TMs shiny without retraction, no obvious effusion, no erythema Nose:  nares patent, mucosa boggy on the left, turbinate edematous Throat/Pharynx:  lips and gingiva without lesion; tongue and uvula midline; non-inflamed pharynx; no exudates or postnasal drainage Neck: neck supple without adenopathy, thyromegaly, or masses Cardiac: RRR, no bruits, no LE edema Lungs:  clear to auscultation, breath sounds equal bilaterally, no respiratory distress Abdomen: BS+, soft, non-tender, non-distended, small bulge over the  midline just above the umbilicus, consistent with his previously known ventral hernia, no organomegaly noted Rectal: Deferred Musculoskeletal:  symmetrical muscle groups noted without atrophy or deformity Neuro: +noticeable intention tremor with hands, gait normal; deep tendon reflexes normal and symmetric Psych: well oriented with normal range of affect and appropriate judgment/insight  Assessment and Plan  Well adult exam - Plan: CBC, Comprehensive metabolic  panel, Lipid panel  Diabetes mellitus type 2 in obese ([place]) - Plan: Hemoglobin A1c, Microalbumin / creatinine urine ratio  Screening for prostate cancer - Plan: PSA  Essential tremor - Plan: propranolol ER (INDERAL LA) 120 MG 24 hr capsule  Ventral hernia without obstruction or gangrene   Well 61 y.o. male. Counseled on diet and exercise. Counseled on risks and benefits of prostate cancer screening with PSA. The patient agrees to undergo testing. Ventral hernia: Standing off to refer to general surgery made, he would like to hold off for now. Essential tremor: Chronic, uncontrolled.  Increase propranolol ER from 80 mg daily to 120 mg daily.   Flu shot today.  Other orders as above. Follow up in 6 months for diabetic visit, I would like to see him in 1 month if he is not doing well with the propranolol dosage increased. The patient voiced understanding and agreement to the plan.  [place], DO 11/20/21 7:58 AM

## 2022-01-14 ENCOUNTER — Telehealth: Payer: Self-pay | Admitting: *Deleted

## 2022-01-14 ENCOUNTER — Ambulatory Visit (INDEPENDENT_AMBULATORY_CARE_PROVIDER_SITE_OTHER): Payer: No Typology Code available for payment source | Admitting: Family Medicine

## 2022-01-14 VITALS — BP 118/78 | HR 68 | Temp 97.8°F | Ht 71.0 in | Wt 230.1 lb

## 2022-01-14 DIAGNOSIS — T63481A Toxic effect of venom of other arthropod, accidental (unintentional), initial encounter: Secondary | ICD-10-CM

## 2022-01-14 MED ORDER — EPINEPHRINE 0.3 MG/0.3ML IJ SOAJ: 0.3000 mg | INTRAMUSCULAR | 2 refills | Status: DC | PRN

## 2022-01-14 NOTE — Telephone Encounter (Signed)
Patient scheduled for today with Dr. Carmelia Roller at 115pm today.

## 2022-01-14 NOTE — Telephone Encounter (Signed)
Call Type Triage / Clinical Relationship To Patient Self Return Phone Number [phone] (Primary) Chief Complaint Bee, Wasp, or Yellow Jacket Sting (no allergic symptoms) Reason for Call Symptomatic / Request for Health Information Initial Comment Caller states he was stung by a bee on Thanksgiving, he had a reaction to it. Caller states he is allergic to bee stings and would like to get a pen prescription for it. Caller states he is not having symptoms at the moment. Translation No Nurse Assessment Nurse: Isabella Bowens, RN, [place] Date/Time (Eastern Time): 01/14/2022 9:55:54 AM Confirm and document reason for call. If symptomatic, describe symptoms. ---Caller states he was stung by a bee on Thanksgiving,he took benadryl for it but ended up passing out. He denies having difficulty breathing. They called an ambulance and he was assessed by the EMTs. Caller states he is allergic to bee stings and would like to get a pen prescription for it. Caller states he is not having symptoms at the moment. Denies triage, would like an appt for the epi pen.

## 2022-01-14 NOTE — Progress Notes (Signed)
Chief Complaint  Patient presents with   Insect Bite    Would like an epi pen     Roger Copeland is a 61 y.o. male here for a skin complaint. Here w spouse.   Duration: 4 days Location: Neck Pruritic? Yes Painful? No Drainage? No He was stung by a yellow jacket Got sweaty and fell. Happened again moments later. Did not bite tongue or act confused afterwards. No headaches or lingering issues since.  Had an issue w a bee sting years ago where he was swollen and had hives.  Pt was eating and drinking normally leading up to fall.  Other associated symptoms: No fevers, swelling, redness Therapies tried thus far: OTC cortisone cream  Past Medical History:  Diagnosis Date   Allergy    Emphysema of lung (HCC)    Seen on CT chest 2020   Essential tremor    Gastroesophageal reflux disease     BP 118/78 (BP Location: Left Arm, Patient Position: Sitting, Cuff Size: Normal)   Pulse 68   Temp 97.8 F (36.6 C) (Oral)   Ht 5\' 11"  (1.803 m)   Wt 230 lb 2 oz (104.4 kg)   SpO2 97%   BMI 32.10 kg/m  Gen: awake, alert, appearing stated age Heart: RRR, no bruits Lungs: CTAB. No accessory muscle use Skin: slightly pinkish papule on dorsum of neck on L. No drainage, erythema, TTP, fluctuance, excoriation Psych: Age appropriate judgment and insight  Insect stings, accidental or unintentional, initial encounter  EpiPen sent for bee stings. Go to ER if we need to use. Briefly discussed how to use. Cont OTC cortisone cream for current issue.  F/u prn. The patient voiced understanding and agreement to the plan.  [place], DO 01/14/22 2:40 PM

## 2022-02-01 ENCOUNTER — Telehealth: Payer: Self-pay

## 2022-02-01 ENCOUNTER — Telehealth (INDEPENDENT_AMBULATORY_CARE_PROVIDER_SITE_OTHER): Payer: No Typology Code available for payment source | Admitting: Family Medicine

## 2022-02-01 ENCOUNTER — Encounter: Payer: Self-pay | Admitting: Family Medicine

## 2022-02-01 DIAGNOSIS — U071 COVID-19: Secondary | ICD-10-CM | POA: Diagnosis not present

## 2022-02-01 MED ORDER — NIRMATRELVIR/RITONAVIR (PAXLOVID)TABLET: 3.0000 | ORAL_TABLET | Freq: Two times a day (BID) | ORAL | 0 refills | Status: AC

## 2022-02-01 MED ORDER — BENZONATATE 200 MG PO CAPS: 200.0000 mg | ORAL_CAPSULE | Freq: Two times a day (BID) | ORAL | 0 refills | Status: DC | PRN

## 2022-02-01 NOTE — Progress Notes (Signed)
Chief Complaint  Patient presents with   Covid Positive    01/31/2022 tested positive covid Fever Congestion cough     Roger Copeland here for URI complaints. Due to COVID-19 pandemic, we are interacting via web portal for an electronic face-to-face visit. I verified patient's ID using 2 identifiers. Patient agreed to proceed with visit via this method. Patient is at home, I am at office. Patient and I are present for visit.   Duration: 1 day  Associated symptoms: subj fevers, sinus congestion, rhinorrhea, loss of taste, and coughing Denies: sinus pain, itchy watery eyes, ear pain, ear drainage, sore throat, wheezing, shortness of breath, myalgia, and loss of smell, N/V/D Treatment to date: INCS, OTC meds, Theraflu Sick contacts: No Tested + for covid yesterday.   Past Medical History:  Diagnosis Date   Allergy    Emphysema of lung (HCC)    Seen on CT chest 2020   Essential tremor    Gastroesophageal reflux disease     Objective No conversational dyspnea Age appropriate judgment and insight Nml affect and mood  COVID-19 - Plan: nirmatrelvir/ritonavir EUA (PAXLOVID) 20 x 150 MG & 10 x 100MG  TABS, benzonatate (TESSALON) 200 MG capsule  He will hold Crestor for 5 d while on Paxlovid and another 3 d after that.  CBC quarantining guidelines discussed.  Continue to push fluids, practice good hand hygiene, cover mouth when coughing. F/u prn. If starting to experience irreplaceable fluid loss, shaking, or shortness of breath, seek immediate care. Pt voiced understanding and agreement to the plan.  [place], DO 02/01/22 12:13 PM

## 2022-02-01 NOTE — Telephone Encounter (Signed)
What time for a video visit today?

## 2022-02-01 NOTE — Telephone Encounter (Signed)
Caller Name Antario Yasuda Caller Phone Number [phone] Patient Name Roger Copeland Patient DOB 02-Sep-1960 Call Type Message Only Information Provided Reason for Call Medication Question / Request Initial Comment Caller states he is COVID positive and wants to get some medication. Disp. Time Disposition Final User 02/01/2022 7:37:37 AM General Information Provided Yes Doran Stabler Call Closed By: Doran Stabler Transaction Date/Time: 02/01/2022 7:36:19 AM (ET)

## 2022-02-01 NOTE — Telephone Encounter (Signed)
Done

## 2022-05-22 ENCOUNTER — Ambulatory Visit (INDEPENDENT_AMBULATORY_CARE_PROVIDER_SITE_OTHER): Payer: No Typology Code available for payment source | Admitting: Family Medicine

## 2022-05-22 ENCOUNTER — Encounter: Payer: Self-pay | Admitting: Family Medicine

## 2022-05-22 VITALS — BP 121/80 | HR 61 | Temp 98.0°F | Ht 72.0 in | Wt 232.1 lb

## 2022-05-22 DIAGNOSIS — Z122 Encounter for screening for malignant neoplasm of respiratory organs: Secondary | ICD-10-CM | POA: Diagnosis not present

## 2022-05-22 DIAGNOSIS — E7800 Pure hypercholesterolemia, unspecified: Secondary | ICD-10-CM | POA: Diagnosis not present

## 2022-05-22 DIAGNOSIS — E669 Obesity, unspecified: Secondary | ICD-10-CM

## 2022-05-22 DIAGNOSIS — G250 Essential tremor: Secondary | ICD-10-CM | POA: Diagnosis not present

## 2022-05-22 DIAGNOSIS — E1169 Type 2 diabetes mellitus with other specified complication: Secondary | ICD-10-CM | POA: Diagnosis not present

## 2022-05-22 DIAGNOSIS — Z23 Encounter for immunization: Secondary | ICD-10-CM | POA: Diagnosis not present

## 2022-05-22 LAB — COMPREHENSIVE METABOLIC PANEL
ALT: 21 U/L (ref 0–53)
AST: 20 U/L (ref 0–37)
Albumin: 4.4 g/dL (ref 3.5–5.2)
Alkaline Phosphatase: 56 U/L (ref 39–117)
BUN: 12 mg/dL (ref 6–23)
CO2: 30 mEq/L (ref 19–32)
Calcium: 9.7 mg/dL (ref 8.4–10.5)
Chloride: 102 mEq/L (ref 96–112)
Creatinine, Ser: 0.88 mg/dL (ref 0.40–1.50)
GFR: 91.52 mL/min (ref >60.00)
Glucose, Bld: 108 mg/dL — ABNORMAL HIGH (ref 70–99)
Potassium: 4.2 mEq/L (ref 3.5–5.1)
Sodium: 138 mEq/L (ref 135–145)
Total Bilirubin: 0.6 mg/dL (ref 0.2–1.2)
Total Protein: 6.8 g/dL (ref 6.0–8.3)

## 2022-05-22 LAB — LIPID PANEL
Cholesterol: 134 mg/dL (ref 0–200)
HDL: 40 mg/dL (ref >39.00)
LDL Cholesterol: 68 mg/dL (ref 0–99)
NonHDL: 94.3
Total CHOL/HDL Ratio: 3
Triglycerides: 131 mg/dL (ref 0.0–149.0)
VLDL: 26.2 mg/dL (ref 0.0–40.0)

## 2022-05-22 LAB — HEMOGLOBIN A1C: Hgb A1c MFr Bld: 6.5 % (ref 4.6–6.5)

## 2022-05-22 MED ORDER — PRIMIDONE 50 MG PO TABS: 50.0000 mg | ORAL_TABLET | Freq: Every day | ORAL | 2 refills | Status: DC

## 2022-05-22 NOTE — Progress Notes (Signed)
Subjective:   Chief Complaint  Patient presents with   Follow-up    Roger Copeland is a 62 y.o. male here for follow-up of diabetes.   Roger Copeland does not routinely monitor his sugars at home.  Patient does not require insulin.   Medications include: diet controlled Diet is good.  Exercise: walking  Hyperlipidemia Patient presents for hyperlipidemia follow up. Currently being treated with Crestor 10 mg/d and compliance with treatment thus far has been good. He denies myalgias. Diet/exercise as above.  The patient is not known to have coexisting coronary artery disease.  Tremor Taking Propranolol XR 120 mg/d. Compliant, no AE's. Not working as well. Wondering if there is anything else. L hand is worse than R. Difficulty typing and doing tasks with it. Worsening steadily since dx several years ago.   Past Medical History:  Diagnosis Date   Allergy    Emphysema of lung    Seen on CT chest 2020   Essential tremor    Gastroesophageal reflux disease      Related testing: Retinal exam: Done Pneumovax: done  Objective:  BP 121/80 (BP Location: Left Arm, Patient Position: Sitting, Cuff Size: Normal)   Pulse 61   Temp 98 F (36.7 C) (Oral)   Ht 6' (1.829 m)   Wt 232 lb 2 oz (105.3 kg)   SpO2 94%   BMI 31.48 kg/m  General:  Well developed, well nourished, in no apparent distress Skin:  Warm, no pallor or diaphoresis on exposed skin Lungs:  CTAB, no access msc use Cardio:  RRR, no bruits, no LE edema Musculoskeletal:  Symmetrical muscle groups noted without atrophy or deformity Neuro:  Sensation intact to pinprick on feet Psych: Age appropriate judgment and insight  Assessment:   Type 2 diabetes mellitus with obesity - Plan: Comprehensive metabolic panel, Lipid panel, Hemoglobin A1c  Pure hypercholesterolemia  Essential tremor  Screening for lung cancer - Plan: Ambulatory Referral Lung Cancer Screening [facility]   Plan:   Chronic, stable. Cont diet control.  Cont Crestor 20 mg/d. Counseled on diet and exercise. Chronic, stable. Cont Crestor 10 mg/d.  Chronic, unstable. Stop propranolol and start primidone 50 mg/d.  Refer pulm.  Tdap today.  F/u in 6 mo. The patient voiced understanding and agreement to the plan.  [place], DO 05/22/22 7:46 AM

## 2022-05-22 NOTE — Addendum Note (Signed)
Addended by: Sharon Seller B on: 05/22/2022 07:53 AM   Modules accepted: Orders

## 2022-05-22 NOTE — Patient Instructions (Addendum)
Give [place] 2-3 business days to get the results of your labs back.   Keep the diet clean and stay active.  Aim to do some physical exertion for 150 minutes per week. This is typically divided into 5 days per week, 30 minutes per day. The activity should be enough to get your heart rate up. Anything is better than nothing if you have time constraints.  If we aren't doing well in the next month with the new medication for your tremor, please see me.   Let us know if you need anything.

## 2022-07-10 ENCOUNTER — Other Ambulatory Visit: Payer: Self-pay | Admitting: Family Medicine

## 2022-07-10 MED ORDER — ROSUVASTATIN CALCIUM 10 MG PO TABS: ORAL_TABLET | ORAL | 3 refills | Status: DC

## 2022-07-17 ENCOUNTER — Encounter: Payer: Self-pay | Admitting: Family Medicine

## 2022-07-17 ENCOUNTER — Other Ambulatory Visit: Payer: Self-pay | Admitting: Family Medicine

## 2022-07-17 DIAGNOSIS — G250 Essential tremor: Secondary | ICD-10-CM

## 2022-07-17 NOTE — Addendum Note (Signed)
Addended by: Scharlene Gloss B on: 07/17/2022 12:54 PM   Modules accepted: Orders

## 2022-08-15 ENCOUNTER — Other Ambulatory Visit: Payer: Self-pay | Admitting: Family Medicine

## 2022-09-03 ENCOUNTER — Encounter: Payer: Self-pay | Admitting: *Deleted

## 2022-11-22 ENCOUNTER — Other Ambulatory Visit: Payer: Self-pay | Admitting: Family Medicine

## 2022-11-22 ENCOUNTER — Ambulatory Visit: Payer: No Typology Code available for payment source | Admitting: Family Medicine

## 2022-11-22 ENCOUNTER — Encounter: Payer: Self-pay | Admitting: Family Medicine

## 2022-11-22 VITALS — BP 118/80 | HR 65 | Temp 98.0°F | Ht 72.0 in | Wt 230.4 lb

## 2022-11-22 DIAGNOSIS — E669 Obesity, unspecified: Secondary | ICD-10-CM

## 2022-11-22 DIAGNOSIS — E1169 Type 2 diabetes mellitus with other specified complication: Secondary | ICD-10-CM

## 2022-11-22 DIAGNOSIS — E7800 Pure hypercholesterolemia, unspecified: Secondary | ICD-10-CM

## 2022-11-22 DIAGNOSIS — G250 Essential tremor: Secondary | ICD-10-CM

## 2022-11-22 DIAGNOSIS — Z122 Encounter for screening for malignant neoplasm of respiratory organs: Secondary | ICD-10-CM

## 2022-11-22 LAB — COMPREHENSIVE METABOLIC PANEL
ALT: 23 U/L (ref 0–53)
AST: 19 U/L (ref 0–37)
Albumin: 4.2 g/dL (ref 3.5–5.2)
Alkaline Phosphatase: 60 U/L (ref 39–117)
BUN: 12 mg/dL (ref 6–23)
CO2: 30 meq/L (ref 19–32)
Calcium: 9.5 mg/dL (ref 8.4–10.5)
Chloride: 100 meq/L (ref 96–112)
Creatinine, Ser: 0.88 mg/dL (ref 0.40–1.50)
GFR: 91.2 mL/min (ref >60.00)
Glucose, Bld: 107 mg/dL — ABNORMAL HIGH (ref 70–99)
Potassium: 4.4 meq/L (ref 3.5–5.1)
Sodium: 138 meq/L (ref 135–145)
Total Bilirubin: 0.6 mg/dL (ref 0.2–1.2)
Total Protein: 6.6 g/dL (ref 6.0–8.3)

## 2022-11-22 LAB — MICROALBUMIN / CREATININE URINE RATIO
Creatinine,U: 126.8 mg/dL
Microalb Creat Ratio: 0.8 mg/g (ref 0.0–30.0)
Microalb, Ur: 1 mg/dL (ref 0.0–1.9)

## 2022-11-22 LAB — LIPID PANEL
Cholesterol: 126 mg/dL (ref 0–200)
HDL: 39.2 mg/dL (ref >39.00)
LDL Cholesterol: 66 mg/dL (ref 0–99)
NonHDL: 86.62
Total CHOL/HDL Ratio: 3
Triglycerides: 103 mg/dL (ref 0.0–149.0)
VLDL: 20.6 mg/dL (ref 0.0–40.0)

## 2022-11-22 LAB — HEMOGLOBIN A1C: Hgb A1c MFr Bld: 6.2 % (ref 4.6–6.5)

## 2022-11-22 MED ORDER — PRIMIDONE 50 MG PO TABS: 50.0000 mg | ORAL_TABLET | Freq: Every day | ORAL | 2 refills | Status: DC

## 2022-11-22 MED ORDER — PROPRANOLOL HCL ER 60 MG PO CP24: 60.0000 mg | ORAL_CAPSULE | Freq: Every day | ORAL | 1 refills | Status: DC

## 2022-11-22 NOTE — Patient Instructions (Addendum)
Give us 2-3 business days to get the results of your labs back.   Keep the diet clean and stay active.  If you do not hear anything about your referral in the next 1-2 weeks, call our office and ask for an update.  Let us know if you need anything. 

## 2022-11-22 NOTE — Progress Notes (Signed)
Subjective:   Chief Complaint  Patient presents with   Follow-up    Tremor medication is not working     Roger Copeland is a 62 y.o. male here for follow-up of diabetes.   Rajesh does not check his sugars at home Patient does not require insulin.   Medications include: diet controlled Diet is OK.  Exercise: walking, lifting wts, active at work  Hyperlipidemia Patient presents for dyslipidemia follow up. Currently being treated with Crestor 20 mg/d and compliance with treatment thus far has been good. He denies myalgias. Diet/exercise as above. No chest pain or shortness of breath. The patient is not known to have coexisting coronary artery disease.  Essential tremor- getting worse. Taking primidone 50 mg/d. No AE's, compliant. Does not seem to be effective. Had done OK with propranolol in the past but was changed to primidone. Went ot Neuro and was offered med adjustments vs DBS which he was not interested in. He is not currently on propranolol. Difficult to do things requiring fine motor skills.   Past Medical History:  Diagnosis Date   Allergy    Emphysema of lung (HCC)    Seen on CT chest 2020   Essential tremor    Gastroesophageal reflux disease      Related testing: Retinal exam: Done Pneumovax: done  Objective:  BP 118/80 (BP Location: Left Arm, Patient Position: Sitting, Cuff Size: Normal)   Pulse 65   Temp 98 F (36.7 C) (Oral)   Ht 6' (1.829 m)   Wt 230 lb 6 oz (104.5 kg)   SpO2 97%   BMI 31.24 kg/m  General:  Well developed, well nourished, in no apparent distress Lungs:  CTAB, no access msc use Cardio:  RRR, no bruits, no LE edema Psych: Age appropriate judgment and insight  Assessment:   Type 2 diabetes mellitus with obesity (HCC) - Plan: Comprehensive metabolic panel, Hemoglobin A1c, Lipid panel, Microalbumin / creatinine urine ratio  Pure hypercholesterolemia  Essential tremor  Screening for lung cancer - Plan: Ambulatory Referral Lung Cancer  Screening [facility]   Plan:   Chronic, stable.  Check above.  Continue diet control.  Counseled on diet and exercise. Chronic, stable.  Continue Crestor 20 mg daily. Chronic, unstable.  Continue primidone 50 mg nightly, add back propranolol LA 60 mg daily.  Follow-up in 1 month for this. Needs long cancer screening. The patient voiced understanding and agreement to the plan.  Jilda Roche [place], DO 11/22/22 7:16 AM

## 2022-12-25 ENCOUNTER — Encounter: Payer: Self-pay | Admitting: Family Medicine

## 2022-12-25 ENCOUNTER — Ambulatory Visit: Payer: No Typology Code available for payment source | Admitting: Family Medicine

## 2022-12-25 VITALS — BP 124/78 | HR 66 | Temp 97.7°F | Ht 73.0 in | Wt 232.2 lb

## 2022-12-25 DIAGNOSIS — G250 Essential tremor: Secondary | ICD-10-CM | POA: Diagnosis not present

## 2022-12-25 NOTE — Patient Instructions (Signed)
Send me a message if anything changes between now and when we see each other next.   Let us know if you need anything.

## 2022-12-25 NOTE — Progress Notes (Signed)
Chief Complaint  Patient presents with   Follow-up    Follow up    Subjective: Patient is a 62 y.o. male here for f/u tremor.  Patient had been taking primidone 50 mg nightly.  Last visit 1 month ago, propranolol LA 60 mg daily was added back.  He did see neurology who offered him deep brain stimulation which he politely declined.  Since that time, he reports around 60-70% improvement. Typing and writing is much more tolerable. No AE's. Compliant.   Past Medical History:  Diagnosis Date   Allergy    Emphysema of lung (HCC)    Seen on CT chest 2020   Essential tremor    Gastroesophageal reflux disease     Objective: BP 124/78 (BP Location: Left Arm, Patient Position: Sitting, Cuff Size: Normal)   Pulse 66   Temp 97.7 F (36.5 C) (Oral)   Ht 6\' 1"  (1.854 m)   Wt 232 lb 3.2 oz (105.3 kg)   SpO2 96%   BMI 30.64 kg/m  General: Awake, appears stated age Neuro: No resting tremor. Gait normal. Slight tremor when holding hands up.  Lungs: No accessory muscle use Psych: Age appropriate judgment and insight, normal affect and mood  Assessment and Plan: Essential tremor  Chronic, stable. Cont primidone 50 mg qhs, propranolol LA 60 mg/d.  F/u in 5 mo for CPE or prn.  The patient voiced understanding and agreement to the plan.  Jilda Roche [place], DO 12/25/22  7:09 AM

## 2023-05-28 ENCOUNTER — Ambulatory Visit (INDEPENDENT_AMBULATORY_CARE_PROVIDER_SITE_OTHER): Payer: No Typology Code available for payment source | Admitting: Family Medicine

## 2023-05-28 ENCOUNTER — Encounter: Payer: Self-pay | Admitting: Family Medicine

## 2023-05-28 VITALS — BP 134/86 | HR 60 | Temp 98.2°F | Resp 18 | Ht 73.0 in | Wt 236.0 lb

## 2023-05-28 DIAGNOSIS — E669 Obesity, unspecified: Secondary | ICD-10-CM

## 2023-05-28 DIAGNOSIS — E1169 Type 2 diabetes mellitus with other specified complication: Secondary | ICD-10-CM

## 2023-05-28 DIAGNOSIS — Z125 Encounter for screening for malignant neoplasm of prostate: Secondary | ICD-10-CM | POA: Diagnosis not present

## 2023-05-28 DIAGNOSIS — Z0000 Encounter for general adult medical examination without abnormal findings: Secondary | ICD-10-CM

## 2023-05-28 DIAGNOSIS — F17210 Nicotine dependence, cigarettes, uncomplicated: Secondary | ICD-10-CM | POA: Diagnosis not present

## 2023-05-28 LAB — COMPREHENSIVE METABOLIC PANEL WITH GFR
ALT: 24 U/L (ref 0–53)
AST: 22 U/L (ref 0–37)
Albumin: 4.6 g/dL (ref 3.5–5.2)
Alkaline Phosphatase: 59 U/L (ref 39–117)
BUN: 12 mg/dL (ref 6–23)
CO2: 32 meq/L (ref 19–32)
Calcium: 9.7 mg/dL (ref 8.4–10.5)
Chloride: 97 meq/L (ref 96–112)
Creatinine, Ser: 0.91 mg/dL (ref 0.40–1.50)
GFR: 89.07 mL/min (ref >60.00)
Glucose, Bld: 114 mg/dL — ABNORMAL HIGH (ref 70–99)
Potassium: 4.3 meq/L (ref 3.5–5.1)
Sodium: 136 meq/L (ref 135–145)
Total Bilirubin: 0.5 mg/dL (ref 0.2–1.2)
Total Protein: 7 g/dL (ref 6.0–8.3)

## 2023-05-28 LAB — CBC
HCT: 46.5 % (ref 39.0–52.0)
Hemoglobin: 15.7 g/dL (ref 13.0–17.0)
MCHC: 33.7 g/dL (ref 30.0–36.0)
MCV: 94.5 fl (ref 78.0–100.0)
Platelets: 239 10*3/uL (ref 150.0–400.0)
RBC: 4.92 Mil/uL (ref 4.22–5.81)
RDW: 13.7 % (ref 11.5–15.5)
WBC: 8.5 10*3/uL (ref 4.0–10.5)

## 2023-05-28 LAB — LIPID PANEL
Cholesterol: 127 mg/dL (ref 0–200)
HDL: 38.2 mg/dL — ABNORMAL LOW (ref >39.00)
LDL Cholesterol: 67 mg/dL (ref 0–99)
NonHDL: 88.79
Total CHOL/HDL Ratio: 3
Triglycerides: 109 mg/dL (ref 0.0–149.0)
VLDL: 21.8 mg/dL (ref 0.0–40.0)

## 2023-05-28 LAB — HEMOGLOBIN A1C: Hgb A1c MFr Bld: 6.6 % — ABNORMAL HIGH (ref 4.6–6.5)

## 2023-05-28 LAB — PSA: PSA: 0.67 ng/mL (ref 0.10–4.00)

## 2023-05-28 MED ORDER — PROPRANOLOL HCL ER 60 MG PO CP24
60.0000 mg | ORAL_CAPSULE | Freq: Every day | ORAL | 3 refills | Status: AC
  Filled 2023-06-03 – 2023-08-13 (×2): qty 90, 90d supply, fill #0
  Filled 2023-11-10: qty 90, 90d supply, fill #1
  Filled 2024-02-03: qty 90, 90d supply, fill #2

## 2023-05-28 NOTE — Progress Notes (Signed)
 Noted.    MyChart message sent to patient

## 2023-05-28 NOTE — Progress Notes (Signed)
 Chief Complaint  Patient presents with   Annual Exam    Well Male Roger Copeland is here for a complete physical.   His last physical was >1 year ago.  Current diet: in general, diet is fair  Current exercise: walking, various workouts Weight trend: stable Fatigue out of ordinary? No. Seat belt? Yes.   Advanced directive? Yes  Health maintenance Shingrix- Yes Colonoscopy- Yes Tetanus- Yes HIV- Yes Hep C- Yes Lung cancer screening- Due   Past Medical History:  Diagnosis Date   Allergy    Emphysema of lung (HCC)    Seen on CT chest 2020   Essential tremor    Gastroesophageal reflux disease       Past Surgical History:  Procedure Laterality Date   NO PAST SURGERIES      Medications  Current Outpatient Medications on File Prior to Visit  Medication Sig Dispense Refill   EPINEPHrine (EPIPEN 2-PAK) 0.3 mg/0.3 mL IJ SOAJ injection Inject 0.3 mg into the muscle as needed for anaphylaxis. 1 each 2   fluticasone (FLONASE) 50 MCG/ACT nasal spray Place 2 sprays into both nostrils daily. 16 g 6   Multiple Vitamin (MULTIVITAMIN) tablet Take 1 tablet by mouth daily.     omeprazole (PRILOSEC) 20 MG capsule Take 20 mg by mouth daily.     primidone (MYSOLINE) 50 MG tablet Take 1 tablet (50 mg total) by mouth at bedtime. 90 tablet 2   rosuvastatin (CRESTOR) 10 MG tablet TAKE 1 TABLET(10 MG) BY MOUTH DAILY 90 tablet 3    Allergies Allergies  Allergen Reactions   Bee Venom Hives    Family History Family History  Problem Relation Age of Onset   Heart failure Mother    Heart disease Mother    Lung cancer Father    Cancer Father     Review of Systems: Constitutional:  no fevers Eye:  no recent significant change in vision Ear/Nose/Mouth/Throat:  Ears:  no hearing loss Nose/Mouth/Throat:  no complaints of nasal congestion, no sore throat Cardiovascular:  no chest pain Respiratory:  no shortness of breath Gastrointestinal:  no change in bowel habits GU:  Male: negative for  dysuria, frequency Musculoskeletal/Extremities:  no joint pain Integumentary (Skin/Breast):  no abnormal skin lesions reported Neurologic:  no headaches Endocrine: No unexpected weight changes Hematologic/Lymphatic:  no abnormal bleeding  Exam BP 134/86   Pulse 60   Temp 98.2 F (36.8 C)   Resp 18   Ht 6\' 1"  (1.854 m)   Wt 236 lb (107 kg)   SpO2 98%   BMI 31.14 kg/m  General:  well developed, well nourished, in no apparent distress Skin:  no significant moles, warts, or growths on exposed skin Head:  no masses, lesions, or tenderness Eyes:  pupils equal and round, sclera anicteric without injection Ears:  canals without lesions, TMs shiny without retraction, no obvious effusion, no erythema Nose:  nares patent, mucosa normal Throat/Pharynx:  lips and gingiva without lesion; tongue and uvula midline; non-inflamed pharynx; no exudates or postnasal drainage Neck: neck supple without adenopathy, thyromegaly, or masses Cardiac: RRR, no bruits, no LE edema, dp pulses 2+ b/l Lungs:  clear to auscultation, breath sounds equal bilaterally, no respiratory distress Abdomen: BS+, soft, non-tender, non-distended, no masses or organomegaly noted Rectal: Deferred Musculoskeletal:  symmetrical muscle groups noted without atrophy or deformity Neuro:  gait normal; deep tendon reflexes normal and symmetric; sensation intact to pinprick b/l over feet Psych: well oriented with normal range of affect and appropriate judgment/insight  Assessment and Plan  Well adult exam - Plan: CBC, Comprehensive metabolic panel with GFR, Lipid panel  Type 2 diabetes mellitus with obesity (HCC) - Plan: Hemoglobin A1c  Smokes less than 1 pack a day with greater than 20 pack year history - Plan: CT CHEST LUNG CANCER SCREENING LOW DOSE WO CONTRAST  Screening for prostate cancer - Plan: PSA   Well 63 y.o. male. Counseled on diet and exercise. Counseled on risks and benefits of prostate cancer screening with PSA.  The patient agrees to undergo testing. Will order lung cancer screening again. Will cont until he is 22 as he still smokes intermittently. Recommended cessation.  Immunizations, labs, and further orders as above. Follow up in 6 mo. The patient voiced understanding and agreement to the plan.  Jilda Roche [place], DO 05/28/23 7:45 AM

## 2023-05-28 NOTE — Patient Instructions (Addendum)
 Give [place] 2-3 business days to get the results of your labs back.   Keep the diet clean and stay active.  Please consider adding some weight resistance exercise to your routine. Consider yoga as well.   Let us know if you need anything.

## 2023-06-03 ENCOUNTER — Other Ambulatory Visit (HOSPITAL_BASED_OUTPATIENT_CLINIC_OR_DEPARTMENT_OTHER): Payer: Self-pay

## 2023-07-22 ENCOUNTER — Other Ambulatory Visit: Payer: Self-pay | Admitting: Family Medicine

## 2023-07-22 ENCOUNTER — Other Ambulatory Visit (HOSPITAL_BASED_OUTPATIENT_CLINIC_OR_DEPARTMENT_OTHER): Payer: Self-pay

## 2023-07-22 MED ORDER — ROSUVASTATIN CALCIUM 10 MG PO TABS
10.0000 mg | ORAL_TABLET | Freq: Every day | ORAL | 1 refills | Status: DC
  Filled 2023-07-22: qty 90, 90d supply, fill #0

## 2023-08-04 ENCOUNTER — Other Ambulatory Visit (HOSPITAL_BASED_OUTPATIENT_CLINIC_OR_DEPARTMENT_OTHER): Payer: Self-pay

## 2023-08-04 ENCOUNTER — Other Ambulatory Visit: Payer: Self-pay

## 2023-08-04 MED ORDER — ROSUVASTATIN CALCIUM 10 MG PO TABS
10.0000 mg | ORAL_TABLET | Freq: Every day | ORAL | 1 refills | Status: AC
  Filled 2023-08-04 – 2023-10-17 (×2): qty 90, 90d supply, fill #0
  Filled 2024-01-13: qty 90, 90d supply, fill #1

## 2023-08-13 ENCOUNTER — Other Ambulatory Visit (HOSPITAL_BASED_OUTPATIENT_CLINIC_OR_DEPARTMENT_OTHER): Payer: Self-pay

## 2023-08-13 ENCOUNTER — Other Ambulatory Visit: Payer: Self-pay | Admitting: Family Medicine

## 2023-08-14 ENCOUNTER — Other Ambulatory Visit (HOSPITAL_BASED_OUTPATIENT_CLINIC_OR_DEPARTMENT_OTHER): Payer: Self-pay

## 2023-08-14 MED ORDER — PRIMIDONE 50 MG PO TABS
50.0000 mg | ORAL_TABLET | Freq: Every day | ORAL | 1 refills | Status: DC
  Filled 2023-08-14: qty 90, 90d supply, fill #0
  Filled 2023-11-10: qty 90, 90d supply, fill #1

## 2023-08-15 ENCOUNTER — Other Ambulatory Visit (HOSPITAL_BASED_OUTPATIENT_CLINIC_OR_DEPARTMENT_OTHER): Payer: Self-pay

## 2023-09-01 ENCOUNTER — Ambulatory Visit (HOSPITAL_BASED_OUTPATIENT_CLINIC_OR_DEPARTMENT_OTHER)
Admission: RE | Admit: 2023-09-01 | Discharge: 2023-09-01 | Disposition: A | Discharge status: Home / Self Care | Source: Ambulatory Visit | Attending: Family Medicine | Admitting: Family Medicine

## 2023-09-01 DIAGNOSIS — F17210 Nicotine dependence, cigarettes, uncomplicated: Secondary | ICD-10-CM | POA: Insufficient documentation

## 2023-09-10 ENCOUNTER — Ambulatory Visit: Payer: Self-pay | Admitting: Family Medicine

## 2023-10-17 ENCOUNTER — Other Ambulatory Visit (HOSPITAL_BASED_OUTPATIENT_CLINIC_OR_DEPARTMENT_OTHER): Payer: Self-pay

## 2023-11-10 ENCOUNTER — Other Ambulatory Visit (HOSPITAL_BASED_OUTPATIENT_CLINIC_OR_DEPARTMENT_OTHER): Payer: Self-pay

## 2023-11-26 ENCOUNTER — Other Ambulatory Visit: Payer: Self-pay | Admitting: Medical Genetics

## 2023-12-01 ENCOUNTER — Other Ambulatory Visit (HOSPITAL_BASED_OUTPATIENT_CLINIC_OR_DEPARTMENT_OTHER): Payer: Self-pay

## 2023-12-01 ENCOUNTER — Encounter (HOSPITAL_BASED_OUTPATIENT_CLINIC_OR_DEPARTMENT_OTHER): Payer: Self-pay | Admitting: Pharmacy Technician

## 2023-12-01 ENCOUNTER — Ambulatory Visit: Admitting: Family Medicine

## 2023-12-01 ENCOUNTER — Encounter: Payer: Self-pay | Admitting: Family Medicine

## 2023-12-01 ENCOUNTER — Ambulatory Visit: Payer: Self-pay | Admitting: Family Medicine

## 2023-12-01 VITALS — BP 132/84 | HR 87 | Temp 97.8°F | Resp 16 | Ht 73.0 in | Wt 240.0 lb

## 2023-12-01 DIAGNOSIS — Z1211 Encounter for screening for malignant neoplasm of colon: Secondary | ICD-10-CM

## 2023-12-01 DIAGNOSIS — E119 Type 2 diabetes mellitus without complications: Secondary | ICD-10-CM

## 2023-12-01 DIAGNOSIS — E669 Obesity, unspecified: Secondary | ICD-10-CM | POA: Diagnosis not present

## 2023-12-01 DIAGNOSIS — Z6831 Body mass index (BMI) 31.0-31.9, adult: Secondary | ICD-10-CM

## 2023-12-01 DIAGNOSIS — G250 Essential tremor: Secondary | ICD-10-CM

## 2023-12-01 DIAGNOSIS — Z7985 Long-term (current) use of injectable non-insulin antidiabetic drugs: Secondary | ICD-10-CM

## 2023-12-01 DIAGNOSIS — E7800 Pure hypercholesterolemia, unspecified: Secondary | ICD-10-CM

## 2023-12-01 LAB — HEMOGLOBIN A1C: Hgb A1c MFr Bld: 6.9 % — ABNORMAL HIGH (ref 4.6–6.5)

## 2023-12-01 LAB — MICROALBUMIN / CREATININE URINE RATIO
Creatinine,U: 132.1 mg/dL
Microalb Creat Ratio: 6.2 mg/g (ref 0.0–30.0)
Microalb, Ur: 0.8 mg/dL (ref 0.0–1.9)

## 2023-12-01 LAB — COMPREHENSIVE METABOLIC PANEL WITH GFR
ALT: 25 U/L (ref 0–53)
AST: 21 U/L (ref 0–37)
Albumin: 4.6 g/dL (ref 3.5–5.2)
Alkaline Phosphatase: 64 U/L (ref 39–117)
BUN: 13 mg/dL (ref 6–23)
CO2: 32 meq/L (ref 19–32)
Calcium: 10 mg/dL (ref 8.4–10.5)
Chloride: 98 meq/L (ref 96–112)
Creatinine, Ser: 1.01 mg/dL (ref 0.40–1.50)
GFR: 78.31 mL/min (ref >60.00)
Glucose, Bld: 142 mg/dL — ABNORMAL HIGH (ref 70–99)
Potassium: 4.5 meq/L (ref 3.5–5.1)
Sodium: 138 meq/L (ref 135–145)
Total Bilirubin: 0.5 mg/dL (ref 0.2–1.2)
Total Protein: 7 g/dL (ref 6.0–8.3)

## 2023-12-01 LAB — LIPID PANEL
Cholesterol: 149 mg/dL (ref 0–200)
HDL: 43.9 mg/dL (ref >39.00)
LDL Cholesterol: 73 mg/dL (ref 0–99)
NonHDL: 104.62
Total CHOL/HDL Ratio: 3
Triglycerides: 159 mg/dL — ABNORMAL HIGH (ref 0.0–149.0)
VLDL: 31.8 mg/dL (ref 0.0–40.0)

## 2023-12-01 MED ORDER — TIRZEPATIDE 7.5 MG/0.5ML ~~LOC~~ SOAJ
7.5000 mg | SUBCUTANEOUS | 0 refills | Status: DC
  Filled 2023-12-01 – 2024-02-03 (×2): qty 2, 28d supply, fill #0

## 2023-12-01 MED ORDER — TIRZEPATIDE 5 MG/0.5ML ~~LOC~~ SOAJ
5.0000 mg | SUBCUTANEOUS | 0 refills | Status: AC
  Filled 2023-12-01 – 2024-01-05 (×2): qty 2, 28d supply, fill #0

## 2023-12-01 MED ORDER — TIRZEPATIDE 2.5 MG/0.5ML ~~LOC~~ SOAJ
2.5000 mg | SUBCUTANEOUS | 0 refills | Status: AC
  Filled 2023-12-01 – 2023-12-11 (×2): qty 2, 28d supply, fill #0

## 2023-12-01 MED ORDER — NEFFY 2 MG/0.1ML NA SOLN
1.0000 | Freq: Once | NASAL | 1 refills | Status: AC | PRN
  Filled 2023-12-01: qty 2, 1d supply, fill #0

## 2023-12-01 NOTE — Progress Notes (Signed)
 Subjective:   Chief Complaint  Patient presents with   Follow-up    Follow Up    Roger Copeland is a 63 y.o. male here for follow-up of diabetes.   Roger Copeland does not routinely ck his sugars.  Patient does not require insulin.   Medications include: calisthenics Diet is OK.  Exercise: calisthenics  Hyperlipidemia Patient presents for hyperlipidemia follow up. Currently being treated with Crestor  10 mg/d and compliance with treatment thus far has been good. He complains of myalgias. Diet/exercise as above. No CP or SOB.  The patient is not known to have coexisting coronary artery disease.  BET Currently taking propranolol  LA 60 mg daily, primidone  50 mg at bedtime.  Compliant, no adverse effects.  It somewhat controls his tremor.  Writing is very difficult.  He saw a couple specialists and there was not much that could be done.  Deep brain stimulation was offered but he does not want to do this.  He is not having any pain.  Tremor is intention-related.  Past Medical History:  Diagnosis Date   Allergy    Emphysema of lung (HCC)    Seen on CT chest 2020   Essential tremor    Gastroesophageal reflux disease      Related testing: Retinal exam: Done Pneumovax: done  Objective:  BP 132/84 (BP Location: Left Arm, Patient Position: Sitting)   Pulse 87   Temp 97.8 F (36.6 C) (Oral)   Resp 16   Ht 6' 1 (1.854 m)   Wt 240 lb (108.9 kg)   SpO2 97%   BMI 31.66 kg/m  General:  Well developed, well nourished, in no apparent distress Lungs:  CTAB, no access msc use Cardio:  RRR, no bruits, no LE edema Musculoskeletal:  Symmetrical muscle groups noted without atrophy or deformity Neuro: Tremor noted with grip strength testing Psych: Age appropriate judgment and insight  Assessment:   Type 2 diabetes mellitus in patient with obesity (HCC) - Plan: Comprehensive metabolic panel with GFR, Lipid panel, Hemoglobin A1c, Microalbumin / creatinine urine ratio, tirzepatide (MOUNJARO) 2.5  MG/0.5ML Pen, tirzepatide (MOUNJARO) 5 MG/0.5ML Pen, tirzepatide (MOUNJARO) 7.5 MG/0.5ML Pen  Pure hypercholesterolemia  Essential tremor  Screen for colon cancer - Plan: Ambulatory referral to Gastroenterology   Plan:   Chronic, unsure if stable. Going to add Mounjaro 2.5 mg weekly to help with both sugar control and weight.  He will let me know if there are cost issues.  Counseled on diet and exercise. Chronic, stable.  Continue Crestor  10 mg daily. Chronic, relatively stable.  Continue propranolol  LA 60 mg daily, primidone  50 mg nightly.  Offered another opinion with [facility] neurology which she politely declined for now.  He does not wish to add more medication either.  He does not wish to pursue deep brain stimulation. Refer to GI for updated colon cancer screening.  He does not remember who did his previous scope but knows it was in 2015 and was normal. He is set to get his flu shot at work. F/u in 6 mo. The patient voiced understanding and agreement to the plan.  Mabel Mt [place], DO 12/01/23 7:47 AM

## 2023-12-01 NOTE — Patient Instructions (Addendum)
 Let me know if there are cost issues.  Keep the diet clean and stay active.  If you do not hear anything about your referral in the next 1-2 weeks, call our office and ask for an update.  Give us  2-3 business days to get the results of your labs back.   Let us  know if you need anything.

## 2023-12-03 ENCOUNTER — Other Ambulatory Visit (HOSPITAL_BASED_OUTPATIENT_CLINIC_OR_DEPARTMENT_OTHER): Payer: Self-pay

## 2023-12-04 ENCOUNTER — Other Ambulatory Visit (HOSPITAL_BASED_OUTPATIENT_CLINIC_OR_DEPARTMENT_OTHER): Payer: Self-pay

## 2023-12-08 ENCOUNTER — Encounter: Payer: Self-pay | Admitting: Family Medicine

## 2023-12-08 ENCOUNTER — Telehealth: Payer: Self-pay

## 2023-12-08 ENCOUNTER — Other Ambulatory Visit (HOSPITAL_COMMUNITY): Payer: Self-pay

## 2023-12-08 ENCOUNTER — Other Ambulatory Visit (HOSPITAL_BASED_OUTPATIENT_CLINIC_OR_DEPARTMENT_OTHER): Payer: Self-pay

## 2023-12-08 NOTE — Telephone Encounter (Signed)
 Pharmacy Patient Advocate Encounter   Received notification from Pt Calls Messages that prior authorization for Mounjaro 2.5mg /0.39ml is required/requested.   Insurance verification completed.   The patient is insured through [facility].   Per test claim: PA required; PA submitted to above mentioned insurance via Prompt PA Key/confirmation #/EOC [id number] Status is pending

## 2023-12-09 ENCOUNTER — Other Ambulatory Visit (HOSPITAL_BASED_OUTPATIENT_CLINIC_OR_DEPARTMENT_OTHER): Payer: Self-pay

## 2023-12-10 ENCOUNTER — Other Ambulatory Visit (HOSPITAL_BASED_OUTPATIENT_CLINIC_OR_DEPARTMENT_OTHER): Payer: Self-pay

## 2023-12-11 ENCOUNTER — Other Ambulatory Visit (HOSPITAL_BASED_OUTPATIENT_CLINIC_OR_DEPARTMENT_OTHER): Payer: Self-pay

## 2023-12-11 ENCOUNTER — Other Ambulatory Visit (HOSPITAL_COMMUNITY): Payer: Self-pay

## 2023-12-11 NOTE — Telephone Encounter (Signed)
 Pharmacy Patient Advocate Encounter  Received notification from [employer] that Prior Authorization for Mounjaro 2.5mg /0.10ml has been APPROVED from 12/10/23 to 12/08/24. Ran test claim, Copay is $45. This test claim was processed through [facility]- copay amounts may vary at other pharmacies due to pharmacy/plan contracts, or as the patient moves through the different stages of their insurance plan.   PA #/Case ID/Reference #: [id number]  Approval letter indexed to media tab and spoke with the pharmacy to notify of approval.

## 2023-12-12 NOTE — Telephone Encounter (Signed)
 Message sent to pt PA was approved.

## 2024-01-05 ENCOUNTER — Other Ambulatory Visit (HOSPITAL_BASED_OUTPATIENT_CLINIC_OR_DEPARTMENT_OTHER): Payer: Self-pay

## 2024-01-05 MED ORDER — COMIRNATY 30 MCG/0.3ML IM SUSY
0.3000 mL | PREFILLED_SYRINGE | Freq: Once | INTRAMUSCULAR | 0 refills | Status: AC
  Filled 2024-01-05: qty 0.3, 1d supply, fill #0

## 2024-01-13 ENCOUNTER — Other Ambulatory Visit (HOSPITAL_BASED_OUTPATIENT_CLINIC_OR_DEPARTMENT_OTHER): Payer: Self-pay

## 2024-01-14 ENCOUNTER — Other Ambulatory Visit

## 2024-01-14 DIAGNOSIS — Z006 Encounter for examination for normal comparison and control in clinical research program: Secondary | ICD-10-CM

## 2024-01-27 LAB — GENECONNECT MOLECULAR SCREEN

## 2024-02-03 ENCOUNTER — Other Ambulatory Visit: Payer: Self-pay | Admitting: Family Medicine

## 2024-02-03 ENCOUNTER — Other Ambulatory Visit (HOSPITAL_BASED_OUTPATIENT_CLINIC_OR_DEPARTMENT_OTHER): Payer: Self-pay

## 2024-02-03 MED ORDER — PRIMIDONE 50 MG PO TABS
50.0000 mg | ORAL_TABLET | Freq: Every day | ORAL | 1 refills | Status: AC
  Filled 2024-02-03: qty 90, 90d supply, fill #0

## 2024-02-04 ENCOUNTER — Other Ambulatory Visit (HOSPITAL_BASED_OUTPATIENT_CLINIC_OR_DEPARTMENT_OTHER): Payer: Self-pay

## 2024-02-23 ENCOUNTER — Telehealth: Payer: Self-pay

## 2024-02-23 ENCOUNTER — Telehealth (HOSPITAL_BASED_OUTPATIENT_CLINIC_OR_DEPARTMENT_OTHER): Payer: Self-pay | Admitting: Pharmacy Technician

## 2024-02-23 ENCOUNTER — Other Ambulatory Visit (HOSPITAL_COMMUNITY): Payer: Self-pay

## 2024-02-23 ENCOUNTER — Other Ambulatory Visit (HOSPITAL_BASED_OUTPATIENT_CLINIC_OR_DEPARTMENT_OTHER): Payer: Self-pay

## 2024-02-23 ENCOUNTER — Encounter: Payer: Self-pay | Admitting: Family Medicine

## 2024-02-23 ENCOUNTER — Telehealth (HOSPITAL_BASED_OUTPATIENT_CLINIC_OR_DEPARTMENT_OTHER): Payer: Self-pay

## 2024-02-23 VITALS — BP 128/78 | HR 75 | Temp 97.5°F | Resp 16 | Ht 72.0 in | Wt 228.4 lb

## 2024-02-23 DIAGNOSIS — Z7985 Long-term (current) use of injectable non-insulin antidiabetic drugs: Secondary | ICD-10-CM | POA: Diagnosis not present

## 2024-02-23 DIAGNOSIS — T50905A Adverse effect of unspecified drugs, medicaments and biological substances, initial encounter: Secondary | ICD-10-CM | POA: Diagnosis not present

## 2024-02-23 DIAGNOSIS — E119 Type 2 diabetes mellitus without complications: Secondary | ICD-10-CM

## 2024-02-23 DIAGNOSIS — E669 Obesity, unspecified: Secondary | ICD-10-CM | POA: Diagnosis not present

## 2024-02-23 MED ORDER — OZEMPIC (0.25 OR 0.5 MG/DOSE) 2 MG/3ML ~~LOC~~ SOPN
PEN_INJECTOR | SUBCUTANEOUS | 1 refills | Status: AC
  Filled 2024-02-23 – 2024-02-24 (×2): qty 3, 42d supply, fill #0

## 2024-02-23 MED ORDER — ONDANSETRON 4 MG PO TBDP
4.0000 mg | ORAL_TABLET | Freq: Three times a day (TID) | ORAL | 0 refills | Status: AC | PRN
  Filled 2024-02-23: qty 20, 7d supply, fill #0

## 2024-02-23 MED ORDER — DICYCLOMINE HCL 10 MG PO CAPS
10.0000 mg | ORAL_CAPSULE | Freq: Four times a day (QID) | ORAL | 0 refills | Status: AC | PRN
  Filled 2024-02-23: qty 30, 8d supply, fill #0

## 2024-02-23 NOTE — Telephone Encounter (Signed)
 Copied from CRM [id number]. Topic: Clinical - Medical Advice >> Feb 23, 2024  8:24 AM Roger Copeland wrote: Reason for CRM: Patient taking tirzepatide  (MOUNJARO ) 7.5 MG/0.5ML Pen once a week patient has a large bowel movement emptying stomach nothing for the rest of the week. This happened Friday 02/20/24 feels sick for 2 days patient will not be taking the next injection and would like some direction on what to do next.   Patient was informed they will receive a call back by end of business day   Called pt shceule for today at 1145.

## 2024-02-23 NOTE — Telephone Encounter (Signed)
 Pharmacy Patient Advocate Encounter   Received notification from Pt Calls Messages that prior authorization for Ozempic  (0.25 or 0.5 MG/DOSE) 2MG /3ML pen-injectors  is required/requested.   Insurance verification completed.   The patient is insured through [employer].   Per test claim: PA required; PA submitted to above mentioned insurance via Latent Key/confirmation #/EOC [id number] Status is pending

## 2024-02-23 NOTE — Progress Notes (Signed)
 Chief Complaint  Patient presents with   Medication Management    Patient is here to discuss his Mounjaro  medication with the provider. Patient is having side effects of diarrhea via medication.    Subjective: Patient is a 64 y.o. male here for f/u.  Patient has a history of type 2 diabetes.  He was recently placed on Mounjaro  and titrated up to 7.5 mg weekly.  At each dosage, he had successively worse diarrhea and nausea.  He has associated stomach cramping.  No vomiting, bleeding, fevers.  It did help him lose weight though.  Past Medical History:  Diagnosis Date   Allergy    Emphysema of lung (HCC)    Seen on CT chest 2020   Essential tremor    Gastroesophageal reflux disease     Objective: BP 128/78 (BP Location: Left Arm, Cuff Size: Normal)   Pulse 75   Temp (!) 97.5 F (36.4 C) (Oral)   Resp 16   Ht 6' (1.829 m)   Wt 228 lb 6.4 oz (103.6 kg)   SpO2 95%   BMI 30.98 kg/m  General: Awake, appears stated age Mouth: MMM Abdomen: Bowel sounds present, soft, nondistended, diffuse TTP Heart: RRR, no LE edema Lungs: CTAB, no rales, wheezes or rhonchi. No accessory muscle use Psych: Age appropriate judgment and insight, normal affect and mood  Assessment and Plan: Type 2 diabetes mellitus in patient with obesity (HCC) - Plan: Semaglutide ,0.25 or 0.5MG /DOS, (OZEMPIC , 0.25 OR 0.5 MG/DOSE,) 2 MG/3ML SOPN  Adverse effect of drug, initial encounter - Plan: dicyclomine  (BENTYL ) 10 MG capsule, ondansetron  (ZOFRAN -ODT) 4 MG disintegrating tablet  Adverse effect of chronic medication.  Stop Mounjaro , no injection for the next week (last dose was 6 days ago) and substituted with Ozempic .  We will start over at 0.25 mg weekly for 4 weeks and we will move up to 0.5 mg weekly.  He will let me know if he wishes to stay at the same dose.  Zofran  and Bentyl  sent in for as needed use as he comes off of the medication.  Follow-up as originally scheduled. The patient voiced understanding and  agreement to the plan.  Mabel Mt [place], DO 02/23/2024  12:16 PM

## 2024-02-23 NOTE — Telephone Encounter (Signed)
 PA request has been Received. New Encounter has been or will be created for follow up. For additional info see Pharmacy Prior Auth telephone encounter from 02/23/24.

## 2024-02-23 NOTE — Patient Instructions (Signed)
 Hold off on any injections for the next week.   Keep the diet clean and stay active.  Let us  know if you need anything.

## 2024-02-23 NOTE — Telephone Encounter (Signed)
 Medication: Ozempic (0.25 mg) Able to fill? No Prior authorization required? Yes Co-pay before assistance: n/a

## 2024-02-24 ENCOUNTER — Other Ambulatory Visit (HOSPITAL_COMMUNITY): Payer: Self-pay

## 2024-02-24 ENCOUNTER — Other Ambulatory Visit (HOSPITAL_BASED_OUTPATIENT_CLINIC_OR_DEPARTMENT_OTHER): Payer: Self-pay

## 2024-02-24 NOTE — Telephone Encounter (Signed)
 Pharmacy Patient Advocate Encounter  Received notification from [employer] that Prior Authorization for  Ozempic  (0.25 or 0.5 MG/DOSE) 2MG /3ML pen-injectors  has been APPROVED from 02/24/24 to 02/23/25. Ran test claim, Copay is $25. This test claim was processed through [facility]- copay amounts may vary at other pharmacies due to pharmacy/plan contracts, or as the patient moves through the different stages of their insurance plan.   PA #/Case ID/Reference #: [id number]

## 2024-02-25 ENCOUNTER — Other Ambulatory Visit (HOSPITAL_BASED_OUTPATIENT_CLINIC_OR_DEPARTMENT_OTHER): Payer: Self-pay

## 2024-03-18 ENCOUNTER — Ambulatory Visit (HOSPITAL_BASED_OUTPATIENT_CLINIC_OR_DEPARTMENT_OTHER): Admission: EM | Admit: 2024-03-18 | Discharge: 2024-03-18 | Disposition: A | Discharge status: Home / Self Care

## 2024-03-18 ENCOUNTER — Other Ambulatory Visit (HOSPITAL_BASED_OUTPATIENT_CLINIC_OR_DEPARTMENT_OTHER): Payer: Self-pay

## 2024-03-18 ENCOUNTER — Encounter (HOSPITAL_BASED_OUTPATIENT_CLINIC_OR_DEPARTMENT_OTHER): Payer: Self-pay | Admitting: Emergency Medicine

## 2024-03-18 DIAGNOSIS — J3489 Other specified disorders of nose and nasal sinuses: Secondary | ICD-10-CM | POA: Diagnosis not present

## 2024-03-18 DIAGNOSIS — J111 Influenza due to unidentified influenza virus with other respiratory manifestations: Secondary | ICD-10-CM

## 2024-03-18 DIAGNOSIS — R051 Acute cough: Secondary | ICD-10-CM

## 2024-03-18 MED ORDER — OSELTAMIVIR PHOSPHATE 75 MG PO CAPS
75.0000 mg | ORAL_CAPSULE | Freq: Two times a day (BID) | ORAL | 0 refills | Status: AC
  Filled 2024-03-18: qty 10, 5d supply, fill #0

## 2024-03-18 MED ORDER — PROMETHAZINE-DM 6.25-15 MG/5ML PO SYRP
5.0000 mL | ORAL_SOLUTION | Freq: Four times a day (QID) | ORAL | 0 refills | Status: AC | PRN
  Filled 2024-03-18: qty 118, 6d supply, fill #0

## 2024-03-18 NOTE — ED Triage Notes (Signed)
 Pt c/o left sided sinus pressure and runny nose started yesterday.

## 2024-03-18 NOTE — ED Provider Notes (Signed)
 " [facility]    CSN: [id number] Arrival date & time: 03/18/24  9078      History   Chief Complaint Chief Complaint  Patient presents with   Nasal Congestion   Facial Pain    HPI Roger Copeland is a 64 y.o. male.   64 year old male with report of runny nose, cough, nasal congestion.  Symptoms started on 03/17/2024.  As his symptoms progressed yesterday, he developed significant right sided sinus pressure.  He denies fever, nausea, vomiting, constipation, diarrhea, body aches.     Past Medical History:  Diagnosis Date   Allergy    Emphysema of lung (HCC)    Seen on CT chest 2020   Essential tremor    Gastroesophageal reflux disease     Patient Active Problem List   Diagnosis Date Noted   Type 2 diabetes mellitus in patient with obesity (HCC) 12/07/2020   Prediabetes 05/15/2020   Emphysema of lung (HCC)    Pure hypercholesterolemia 05/07/2017   Vitamin D  insufficiency 05/07/2017   Ventral hernia without obstruction or gangrene 05/07/2017   Tobacco abuse 05/07/2017   Snoring 12/17/2016   Essential tremor 06/09/2012    Past Surgical History:  Procedure Laterality Date   NO PAST SURGERIES         Home Medications    Prior to Admission medications  Medication Sig Start Date End Date Taking? Authorizing Provider  Multiple Vitamin (MULTIVITAMIN) tablet Take 1 tablet by mouth daily.   Yes [provider]  oseltamivir  (TAMIFLU ) 75 MG capsule Take 1 capsule (75 mg total) by mouth every 12 (twelve) hours. 03/18/24  Yes Ival Domino, FNP  primidone  (MYSOLINE ) 50 MG tablet Take 1 tablet (50 mg total) by mouth at bedtime. 02/03/24  Yes Frann Mabel Mt, DO  promethazine -dextromethorphan (PROMETHAZINE -DM) 6.25-15 MG/5ML syrup Take 5 mLs by mouth 4 (four) times daily as needed for cough. Do not use and drive - May make drowsy. 03/18/24  Yes Ival Domino, FNP  propranolol  ER (INDERAL  LA) 60 MG 24 hr capsule Take 1 capsule (60 mg total) by mouth daily.  05/28/23  Yes Frann Mabel Mt, DO  rosuvastatin  (CRESTOR ) 10 MG tablet Take 1 tablet (10 mg total) by mouth daily. 08/04/23  Yes Frann Mabel Mt, DO  Semaglutide ,0.25 or 0.5MG /DOS, (OZEMPIC , 0.25 OR 0.5 MG/DOSE,) 2 MG/3ML SOPN Inject 0.25 mg into the skin once a week for 28 days, THEN 0.5 mg once a week. 02/23/24 04/19/24 Yes Wendling, Mabel Mt, DO  dicyclomine  (BENTYL ) 10 MG capsule Take 1 capsule (10 mg total) by mouth every 6 (six) hours as needed for abdominal cramping. 02/23/24   Frann Mabel Mt, DO  EPINEPHrine  (NEFFY ) 2 MG/0.1ML SOLN Place 1 spray into the nose once as needed for up to 1 dose (anaphylaxis). 12/01/23   Frann Mabel Mt, DO  fluticasone  (FLONASE ) 50 MCG/ACT nasal spray Place 2 sprays into both nostrils daily. 05/16/21   Frann Mabel Mt, DO  omeprazole (PRILOSEC) 20 MG capsule Take 20 mg by mouth daily.    [provider]  omeprazole (PRILOSEC) 20 MG capsule Take 1 capsule by mouth daily.    [provider]  ondansetron  (ZOFRAN -ODT) 4 MG disintegrating tablet Take 1 tablet (4 mg total) by mouth every 8 (eight) hours as needed for nausea or vomiting. 02/23/24   Frann Mabel Mt, DO    Family History Family History  Problem Relation Age of Onset   Heart failure Mother    Heart disease Mother    Lung  cancer Father    Cancer Father     Social History Social History[1]   Allergies   Bee venom   Review of Systems Review of Systems  Constitutional:  Negative for chills and fever.  HENT:  Positive for congestion, postnasal drip, rhinorrhea and sinus pressure. Negative for ear pain, sinus pain and sore throat.   Eyes:  Negative for pain and visual disturbance.  Respiratory:  Positive for cough.   Cardiovascular:  Negative for chest pain and palpitations.  Gastrointestinal:  Negative for abdominal pain, constipation, diarrhea, nausea and vomiting.  Genitourinary:  Negative for dysuria and hematuria.   Musculoskeletal:  Negative for arthralgias and back pain.  Skin:  Negative for color change and rash.  Neurological:  Negative for seizures and syncope.  All other systems reviewed and are negative.    Physical Exam Triage Vital Signs ED Triage Vitals  Encounter Vitals Group     BP 03/18/24 0930 (!) 134/92     Girls Systolic BP Percentile --      Girls Diastolic BP Percentile --      Boys Systolic BP Percentile --      Boys Diastolic BP Percentile --      Pulse Rate 03/18/24 0930 67     Resp 03/18/24 0930 18     Temp 03/18/24 0930 97.9 F (36.6 C)     Temp Source 03/18/24 0930 Oral     SpO2 03/18/24 0930 98 %     Weight --      Height --      Head Circumference --      Peak Flow --      Pain Score 03/18/24 0928 4     Pain Loc --      Pain Education --      Exclude from Growth Chart --    No data found.  Updated Vital Signs BP (!) 134/92 (BP Location: Left Arm)   Pulse 67   Temp 97.9 F (36.6 C) (Oral)   Resp 18   SpO2 98%   Visual Acuity Right Eye Distance:   Left Eye Distance:   Bilateral Distance:    Right Eye Near:   Left Eye Near:    Bilateral Near:     Physical Exam Vitals and nursing note reviewed.  Constitutional:      General: He is not in acute distress.    Appearance: He is well-developed. He is not ill-appearing, toxic-appearing or diaphoretic.  HENT:     Head: Normocephalic and atraumatic.     Right Ear: Hearing, tympanic membrane, ear canal and external ear normal.     Left Ear: Hearing, tympanic membrane, ear canal and external ear normal.     Nose: Congestion and rhinorrhea present.     Right Sinus: No maxillary sinus tenderness or frontal sinus tenderness.     Left Sinus: Maxillary sinus tenderness (Moderate sinus pressure pain) present. No frontal sinus tenderness.     Mouth/Throat:     Lips: Pink.     Mouth: Mucous membranes are moist.     Pharynx: Uvula midline. No oropharyngeal exudate or posterior oropharyngeal erythema.      Tonsils: No tonsillar exudate.  Eyes:     Conjunctiva/sclera: Conjunctivae normal.     Pupils: Pupils are equal, round, and reactive to light.  Cardiovascular:     Rate and Rhythm: Normal rate and regular rhythm.     Heart sounds: S1 normal and S2 normal. No murmur heard. Pulmonary:  Effort: Pulmonary effort is normal. No respiratory distress.     Breath sounds: Normal breath sounds. No decreased breath sounds, wheezing, rhonchi or rales.  Abdominal:     General: Bowel sounds are normal.     Palpations: Abdomen is soft.     Tenderness: There is no abdominal tenderness.  Musculoskeletal:        General: No swelling.     Cervical back: Neck supple.  Lymphadenopathy:     Head:     Right side of head: No submental, submandibular, tonsillar, preauricular or posterior auricular adenopathy.     Left side of head: Tonsillar adenopathy present. No submental, submandibular, preauricular or posterior auricular adenopathy.     Cervical: Cervical adenopathy present.     Right cervical: No superficial cervical adenopathy.    Left cervical: Superficial cervical adenopathy present.  Skin:    General: Skin is warm and dry.     Capillary Refill: Capillary refill takes less than 2 seconds.     Findings: No rash.  Neurological:     Mental Status: He is alert and oriented to person, place, and time.  Psychiatric:        Mood and Affect: Mood normal.      UC Treatments / Results  Labs (all labs ordered are listed, but only abnormal results are displayed) Labs Reviewed - No data to display  EKG   Radiology No results found.  Procedures Procedures (including critical care time)  Medications Ordered in UC Medications - No data to display  Initial Impression / Assessment and Plan / UC Course  I have reviewed the triage vital signs and the nursing notes.  Pertinent labs & imaging results that were available during my care of the patient were reviewed by me and considered in my medical  decision making (see chart for details).  Plan of Care (see discharge instructions for additional patient precautions and education): Probable influenza type A or B with acute onset cough, head congestion and sinus pressure: Tamiflu  75 mg twice daily for 5 days.  Promethazine  DM, 5 mL, every 6 hours if needed for cough.  Encouraged to perform sinus rinses twice daily to open up the sinuses and relieve the pressure.  Get plenty of fluids and rest.  Work excuse provided.  Follow-up if symptoms do not improve, worsen or new symptoms occur.  See below for signs and symptoms of worsening condition and reasons to go to an emergency room.  I reviewed the plan of care with the patient and/or the patient's guardian.  The patient and/or guardian had time to ask questions and acknowledged that the questions were answered.  Final Clinical Impressions(s) / UC Diagnoses   Final diagnoses:  Acute cough  Sinus pressure  Influenza     Discharge Instructions      Probable influenza type A or B with acute onset cough, head congestion and sinus pressure: Tamiflu  75 mg twice daily for 5 days.  Promethazine  DM, 5 mL, every 6 hours if needed for cough.  Encouraged to perform sinus rinses twice daily to open up the sinuses and relieve the pressure.  Get plenty of fluids and rest.  Work excuse provided.  Follow-up if symptoms do not improve, worsen or new symptoms occur.  See below for signs and symptoms of worsening condition and reasons to go to an emergency room.  Get help right away if: You become short of breath or have trouble breathing. Your skin or nails turn blue. You have very bad  pain or stiffness in your neck. You get a sudden headache or pain in your face or ear. You vomit each time you eat or drink. These symptoms may be an emergency. Call 911 right away. Do not wait to see if the symptoms will go away. Do not drive yourself to the hospital.     ED Prescriptions     Medication Sig  Dispense Auth. Provider   oseltamivir  (TAMIFLU ) 75 MG capsule Take 1 capsule (75 mg total) by mouth every 12 (twelve) hours. 10 capsule Ival Domino, FNP   promethazine -dextromethorphan (PROMETHAZINE -DM) 6.25-15 MG/5ML syrup Take 5 mLs by mouth 4 (four) times daily as needed for cough. Do not use and drive - May make drowsy. 118 mL Ival Domino, FNP      PDMP not reviewed this encounter.    [1]  Social History Tobacco Use   Smoking status: Every Day    Current packs/day: 0.75    Average packs/day: 0.8 packs/day for 40.0 years (30.0 ttl pk-yrs)    Types: Cigarettes   Smokeless tobacco: Never  Substance Use Topics   Alcohol use: Yes    Comment: drinks on occasions   Drug use: No     Ival Domino, FNP 03/18/24 [phone]  "

## 2024-03-18 NOTE — Discharge Instructions (Addendum)
 Probable influenza type A or B with acute onset cough, head congestion and sinus pressure: Tamiflu  75 mg twice daily for 5 days.  Promethazine  DM, 5 mL, every 6 hours if needed for cough.  Encouraged to perform sinus rinses twice daily to open up the sinuses and relieve the pressure.  Get plenty of fluids and rest.  Work excuse provided.  Follow-up if symptoms do not improve, worsen or new symptoms occur.  See below for signs and symptoms of worsening condition and reasons to go to an emergency room.  Get help right away if: You become short of breath or have trouble breathing. Your skin or nails turn blue. You have very bad pain or stiffness in your neck. You get a sudden headache or pain in your face or ear. You vomit each time you eat or drink. These symptoms may be an emergency. Call 911 right away. Do not wait to see if the symptoms will go away. Do not drive yourself to the hospital.

## 2024-03-25 ENCOUNTER — Encounter: Payer: Self-pay | Admitting: Internal Medicine

## 2024-04-28 ENCOUNTER — Encounter

## 2024-05-12 ENCOUNTER — Encounter: Admitting: Internal Medicine

## 2024-05-31 ENCOUNTER — Encounter: Admitting: Family Medicine
